# Patient Record
Sex: Female | Born: 1980 | Race: White | Hispanic: No | Marital: Married | Smoking: Never smoker
Health system: Southern US, Community
[De-identification: ages and names within clinical notes are randomized; demographics above are authoritative.]

## PROBLEM LIST (undated history)

## (undated) DIAGNOSIS — I10 Essential (primary) hypertension: Secondary | ICD-10-CM

## (undated) DIAGNOSIS — G43909 Migraine, unspecified, not intractable, without status migrainosus: Secondary | ICD-10-CM

## (undated) DIAGNOSIS — E119 Type 2 diabetes mellitus without complications: Secondary | ICD-10-CM

## (undated) HISTORY — DX: Type 2 diabetes mellitus without complications: E11.9

## (undated) HISTORY — DX: Essential (primary) hypertension: I10

## (undated) HISTORY — DX: Migraine, unspecified, not intractable, without status migrainosus: G43.909

---

## 2005-12-15 ENCOUNTER — Emergency Department (HOSPITAL_COMMUNITY): Admission: EM | Admit: 2005-12-15 | Discharge: 2005-12-15 | Payer: Self-pay | Admitting: Family Medicine

## 2006-01-25 ENCOUNTER — Emergency Department (HOSPITAL_COMMUNITY): Admission: EM | Admit: 2006-01-25 | Discharge: 2006-01-25 | Payer: Self-pay | Admitting: Family Medicine

## 2006-12-22 HISTORY — PX: MOUTH SURGERY: SHX715

## 2009-07-22 ENCOUNTER — Encounter: Admission: RE | Admit: 2009-07-22 | Discharge: 2009-08-20 | Payer: Self-pay | Admitting: Obstetrics & Gynecology

## 2009-10-12 ENCOUNTER — Inpatient Hospital Stay (HOSPITAL_COMMUNITY): Admission: AD | Admit: 2009-10-12 | Discharge: 2009-10-16 | Payer: Self-pay | Admitting: Obstetrics & Gynecology

## 2010-11-11 LAB — CBC
HCT: 32.3 % — ABNORMAL LOW (ref 36.0–46.0)
MCHC: 33 g/dL (ref 30.0–36.0)
MCV: 90.1 fL (ref 78.0–100.0)
MCV: 91.5 fL (ref 78.0–100.0)
Platelets: 175 10*3/uL (ref 150–400)
RBC: 4.12 MIL/uL (ref 3.87–5.11)
RDW: 14.4 % (ref 11.5–15.5)
WBC: 10.6 10*3/uL — ABNORMAL HIGH (ref 4.0–10.5)

## 2010-11-11 LAB — GLUCOSE, CAPILLARY
Glucose-Capillary: 85 mg/dL (ref 70–99)
Glucose-Capillary: 88 mg/dL (ref 70–99)

## 2010-11-11 LAB — GLUCOSE, RANDOM: Glucose, Bld: 79 mg/dL (ref 70–99)

## 2010-11-11 LAB — RPR: RPR Ser Ql: NONREACTIVE

## 2013-10-19 ENCOUNTER — Ambulatory Visit (INDEPENDENT_AMBULATORY_CARE_PROVIDER_SITE_OTHER): Payer: BC Managed Care – PPO | Admitting: Neurology

## 2013-10-19 ENCOUNTER — Encounter: Payer: Self-pay | Admitting: Neurology

## 2013-10-19 ENCOUNTER — Ambulatory Visit: Payer: BC Managed Care – PPO | Admitting: Neurology

## 2013-10-19 ENCOUNTER — Encounter (INDEPENDENT_AMBULATORY_CARE_PROVIDER_SITE_OTHER): Payer: Self-pay

## 2013-10-19 VITALS — BP 126/86 | HR 102 | Temp 98.2°F | Ht 61.75 in | Wt 145.0 lb

## 2013-10-19 DIAGNOSIS — G43109 Migraine with aura, not intractable, without status migrainosus: Secondary | ICD-10-CM

## 2013-10-19 MED ORDER — PROMETHAZINE HCL 12.5 MG PO TABS: 12.5000 mg | ORAL_TABLET | ORAL | Status: DC | PRN

## 2013-10-19 MED ORDER — RIZATRIPTAN BENZOATE 5 MG PO TABS: 5.0000 mg | ORAL_TABLET | ORAL | Status: DC | PRN

## 2013-10-19 NOTE — Patient Instructions (Addendum)
Please remember, common headache triggers are: sleep deprivation, dehydration, overheating, stress, hypoglycemia or skipping meals and blood sugar fluctuations, excessive pain medications or excessive alcohol use or caffeine withdrawal. Some people have food triggers such as aged cheese, orange juice or chocolate, especially dark chocolate, or MSG (monosodium glutamate). Try to avoid these headache triggers as much possible. It may be helpful to keep a headache diary to figure out what makes your headaches worse or brings them on and what alleviates them. Some people report headache onset after exercise but studies have shown that regular exercise may actually prevent headaches from coming. If you have exercise-induced headaches, please make sure that you drink plenty of fluid before and after exercising and that you do not over do it and do not overheat.  For migraine prevention, I would like to suggest no new medications, as your migraines are thankfully not too frequent at this point. Down to road, we may try something like Neurontin.   We will do a brain MRI with and without contrast, you can do this at your work.   For migraine abortive treatment, try the Maxalt orally disintegrating tab, 5 mg: take 1 pill early on when you suspect a migraine attack coming on. You may take another pill within 2 hours, no more than 2 pills in 24 hours. Most people who take triptans do not have any serious side-effects. However, they can cause drowsiness (remember to not drive or use heavy machinery when drowsy), nausea, dizziness, dry mouth. Less common side effects include strange sensations, such as tightness in your chest or throat, tingling, flushing, and feelings of heaviness or pressure in areas such as the face, limbs, and chest. These in the chest can mimic heart related pain (angina) and may cause alarm, but usually these sensations are not harmful or a sign of a heart attack. However, if you develop intense chest  pain or sensations of discomfort, you should stop taking your medication and consult with me or your PCP or go to the nearest urgent care facility or ER or call 911.   For nausea, I will prescribe phenergan, starting with 12.5 mg orally as needed. It can make you sleepy. Rare side effects are abnormal movements.

## 2013-10-19 NOTE — Progress Notes (Signed)
Subjective:    Patient ID: Andrea Love is a 33 y.o. female.  HPI    Huston FoleySaima Shawana Knoch, MD, PhD [facility] [address], [address] [address] [place], [place] [id number]  Dear Susa DayVaishali,   I saw your patient, Lorenso QuarryKristi Wells, upon your kind request in my neurologic clinic today for initial consultation of her recurrent headaches, concern for migraines. The patient is unaccompanied today. As you know, Ms. [name] is a very pleasant 33 year old right-handed woman with an underlying medical history of depression, who reports being diagnosed with migraines in April 2007. [facility] and was treated with a shot, which made her extremely sleepy. She has been having about 1-2 migraines per month and every three months, when she is on the placebo week of her OCP she has a severe migraine. She has been taking Fioricet prn. She has associated photophobia and sonophobia, and she has occasional nausea, no vomiting. She has a throbbing HA, mostly in the temporal areas. She has no associated neurological symptoms, except for intermittent L upper lip drawing sensation. No one sided weakness, numbness, tingling. She has a visual aura consisting of flashing dots or stars coming from behind into her visual fields. She has been taking Advil, tylenol or aleve prn, but all make her sleepy and Fioricet also causes some palpitations. It helps to sleep. Her migraine headaches last about 24 hours.  There is no family history of migraine. There is a family history of brain tumor and her paternal grandfather mother and her maternal great-grandfather had a brain tumor. There is epilepsy on her mother's side as well, maternal GF.  Her Past Medical History Is Significant For: History reviewed. No pertinent past medical history.  Her Past Surgical History Is Significant For: Past Surgical History  Procedure Laterality Date  . Mouth surgery  12/2006    wisdom teeth  . Cesarean section  10/13/2009     Her Family History Is Significant For: Family History  Problem Relation Age of Onset  . Diabetes Mother   . Diabetes Paternal Grandfather   . Seizures Maternal Grandfather   . Breast cancer Maternal Grandmother   . Bone cancer Maternal Grandmother   . Prostate cancer Paternal Grandfather     Her Social History Is Significant For: History   Social History  . Marital Status: Married    Spouse Name: N/A    Number of Children: N/A  . Years of Education: N/A   Social History Main Topics  . Smoking status: Never Smoker   . Smokeless tobacco: None  . Alcohol Use: 0.0 oz/week    .5 drink(s) per week  . Drug Use: No  . Sexual Activity: None   Other Topics Concern  . None   Social History Narrative  . None    Her Allergies Are:  Allergies  Allergen Reactions  . Macrobid [employer][Nitrofurantoin Monohyd Macro]   . Sulfa Antibiotics   :   Her Current Medications Are:  Outpatient Encounter Prescriptions as of 10/19/2013  Medication Sig  . acetaminophen (TYLENOL) 500 MG tablet Take 500 mg by mouth every 6 (six) hours as needed.  . butalbital-acetaminophen-caffeine (FIORICET, ESGIC) 50-325-40 MG per tablet Take 1 tablet by mouth 2 (two) times daily as needed for headache.  . ibuprofen (ADVIL,MOTRIN) 200 MG tablet Take 200 mg by mouth every 6 (six) hours as needed.  Marcille Blanco. JOLESSA 0.15-0.03 MG tablet Take 1 tablet by mouth daily.  . ciprofloxacin (CIPRO) 500 MG/5ML (10%) suspension  Take 500 mg by mouth 2 (two) times daily.  . promethazine (PHENERGAN) 12.5 MG tablet Take 1 tablet (12.5 mg total) by mouth as needed for nausea or vomiting. No more than one per day as needed for nausea associated with migraine.  . rizatriptan (MAXALT) 5 MG tablet Take 1 tablet (5 mg total) by mouth as needed for migraine. May repeat in 2 hours if needed. Take no more than 2 pills in 24 hours and no more than 3 pill a week.  :  Review of Systems:  Out of a complete 14 point review of systems, all are  reviewed and negative with the exception of these symptoms as listed below:    Review of Systems  Constitutional: Negative.   Eyes: Negative.   Respiratory: Negative.   Cardiovascular: Negative.   Gastrointestinal: Negative.   Endocrine: Negative.   Genitourinary: Negative.   Musculoskeletal: Negative.   Skin: Negative.   Allergic/Immunologic: Negative.   Neurological: Positive for headaches.  Hematological: Negative.   Psychiatric/Behavioral: Positive for dysphoric mood. The patient is nervous/anxious.     Objective:  Neurologic Exam  Physical Exam Physical Examination:   Filed Vitals:   10/19/13 1107  BP: 126/86  Pulse: 102  Temp: 98.2 F (36.8 C)    General Examination: The patient is a very pleasant 33 y.o. female in no acute distress. She appears well-developed and well-nourished and well groomed.   HEENT: Normocephalic, atraumatic, pupils are equal, round and reactive to light and accommodation. Funduscopic exam is normal with sharp disc margins noted. Extraocular tracking is good without limitation to gaze excursion or nystagmus noted. Normal smooth pursuit is noted. Hearing is grossly intact. Tympanic membranes are clear bilaterally. Face is symmetric with normal facial animation and normal facial sensation. Speech is clear with no dysarthria noted. There is no hypophonia. There is no lip, neck/head, jaw or voice tremor. Neck is supple with full range of passive and active motion. There are no carotid bruits on auscultation. Oropharynx exam reveals: mild mouth dryness, good dental hygiene and no significant airway crowding. Mallampati is class I. Tongue protrudes centrally and palate elevates symmetrically. Tonsils are small.   Chest: Clear to auscultation without wheezing, rhonchi or crackles noted.  Heart: S1+S2+0, regular and normal without murmurs, rubs or gallops noted.   Abdomen: Soft, non-tender and non-distended with normal bowel sounds appreciated on  auscultation.  Extremities: There is no pitting edema in the distal lower extremities bilaterally. Pedal pulses are intact.  Skin: Warm and dry without trophic changes noted. There are no varicose veins.  Musculoskeletal: exam reveals no obvious joint deformities, tenderness or joint swelling or erythema.   Neurologically:  Mental status: The patient is awake, alert and oriented in all 4 spheres. Her immediate and remote memory, attention, language skills and fund of knowledge are appropriate. There is no evidence of aphasia, agnosia, apraxia or anomia. Speech is clear with normal prosody and enunciation. Thought process is linear. Mood is normal and affect is normal.  Cranial nerves II - XII are as described above under HEENT exam. In addition: shoulder shrug is normal with equal shoulder height noted. Motor exam: Normal bulk, strength and tone is noted. There is no drift, tremor or rebound. Romberg is negative. Reflexes are 2+ throughout. Babinski: Toes are flexor bilaterally. Fine motor skills and coordination: intact with normal finger taps, normal hand movements, normal rapid alternating patting, normal foot taps and normal foot agility.  Cerebellar testing: No dysmetria or intention tremor on finger to nose  testing. Heel to shin is unremarkable bilaterally. There is no truncal or gait ataxia.  Sensory exam: intact to light touch, pinprick, vibration, temperature sense and proprioception in the upper and lower extremities.  Gait, station and balance: She stands easily. No veering to one side is noted. No leaning to one side is noted. Posture is age-appropriate and stance is narrow based. Gait shows normal stride length and normal pace. No problems turning are noted. She turns en bloc. Tandem walk is unremarkable. Intact toe and heel stance is noted.               Assessment and Plan:   In summary, FLORINA GLAS is a very pleasant 33 y.o.-year old female with an underlying medical history of  depression and obesity with weight loss successfully achieved in the last 6 months, whose history and physical exam are in keeping with the diagnosis of migraines with aura. Thankfully her migraines are not very frequent at this time with 2 migraine attacks reported per month on average at this point. Her physical exam is completely nonfocal. She has tried Imitrex with naproxen and combination before but it made her sleepy. It looks like she is quite sensitive to medications and even Advil can make her sleepy. I talked to her about migraine prevention and abortive treatment today. I do not think she needs to be on a daily medication for migraine prevention at this point. Nevertheless, down the Road we can consider something like gabapentin. For abortive treatment I suggested low-dose Maxalt. Again, she is quite sensitive to medications and becomes quite sleepy with even over-the-counter pain medications. Therefore, she can really only take the Maxalt when she is at home and does not have to drive. For occasional nausea I will prescribe Phenergan. We talked about side effects of Maxalt and Phenergan. We talked about maintaining a healthy lifestyle in general. I encouraged the patient to eat healthy, exercise daily and keep well hydrated, to keep a scheduled bedtime and wake time routine, to not skip any meals and eat healthy snacks in between meals and to have protein with every meal. Of note, she does not drink caffeine daily. I advised the patient about common headache triggers: sleep deprivation, dehydration, overheating, stress, hypoglycemia or skipping meals and blood sugar fluctuations, excessive pain medications or excessive alcohol use or caffeine withdrawal. Some people have food triggers such as aged cheese, orange juice or chocolate, especially dark chocolate, or MSG (monosodium glutamate). She is to try to avoid these headache triggers as much possible. It may be helpful to keep a headache diary to  figure out what makes Her headaches worse or brings them on and what alleviates them. Some people report headache onset after exercise but studies have shown that regular exercise may actually prevent headaches from coming. If She has exercise-induced headaches, She is advised to drink plenty of fluid before and after exercising and that to not overdo it and to not overheat.  As far as further diagnostic testing is concerned, I suggested the following today: I would like for her to schedule a brain MRI with and without contrast. She has a family history of brain tumor and epilepsy.  She can continue to take Fioricet as needed. She is advised not to combine Maxalt and Fioricet. I will see her back in about 6 months or sooner if needed. In the interim, once she has had her brain MRI I will call her with the test results once I receive them. Thank you  very much for allowing me to participate in the care of this nice patient. If I can be of any further assistance to you please do not hesitate to call me at [phone].  Sincerely,   Star Age, MD, PhD

## 2014-04-18 ENCOUNTER — Ambulatory Visit: Payer: BC Managed Care – PPO | Admitting: Neurology

## 2014-07-09 ENCOUNTER — Telehealth: Payer: Self-pay | Admitting: Neurology

## 2014-07-09 DIAGNOSIS — F419 Anxiety disorder, unspecified: Secondary | ICD-10-CM

## 2014-07-09 MED ORDER — ALPRAZOLAM 0.5 MG PO TABS: 0.5000 mg | ORAL_TABLET | Freq: Every evening | ORAL | Status: DC | PRN

## 2014-07-09 NOTE — Telephone Encounter (Signed)
Xanax prescribed. She can pick up prescription or we can mail. Please find out. Also, pls advise patient that she should not drive after taking Xanax and she should have someone drive her to and from the MRI test.

## 2014-07-09 NOTE — Telephone Encounter (Signed)
I called back.  Got no answer.  Left message.  

## 2014-07-09 NOTE — Telephone Encounter (Signed)
[facility] and requesting medication to help relax her.  Please call and advise.

## 2014-07-10 NOTE — Telephone Encounter (Signed)
Patient called front desk saying she would like the Rx faxed to [facility].  Rx has been faxed.  I called the patient back to advise.  Got no answer.  Left message.

## 2014-07-30 ENCOUNTER — Telehealth: Payer: Self-pay | Admitting: Neurology

## 2014-07-30 ENCOUNTER — Other Ambulatory Visit: Payer: Self-pay | Admitting: Neurology

## 2014-07-30 DIAGNOSIS — G43109 Migraine with aura, not intractable, without status migrainosus: Secondary | ICD-10-CM

## 2014-07-30 NOTE — Telephone Encounter (Signed)
Called patient and she said to fax the referral to her work at [phone],it does not need pre approval, faxed referral 07/30/14 to patient per her request

## 2014-07-30 NOTE — Telephone Encounter (Signed)
Pt is calling stating she needs a new MRI order to be [facility].  She has an appointment scheduled today @ 4:30.  The order needs to be updated, the order in the system is from February.  Please advise.

## 2014-09-11 ENCOUNTER — Telehealth: Payer: Self-pay | Admitting: Neurology

## 2014-09-11 NOTE — Telephone Encounter (Signed)
Spoke with patient and [facility], will fax results to our office for Dr Frances FurbishAthar to review

## 2014-09-11 NOTE — Telephone Encounter (Signed)
Shared MRI results with patient, she verbalized understanding and said ok.

## 2014-09-11 NOTE — Telephone Encounter (Signed)
Patient requesting MRI results.  Please call and advise. °

## 2014-09-11 NOTE — Telephone Encounter (Signed)
Please call and advise the patient that the recent scan we requested was within normal limits. 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 In particular, there were no acute findings, such as a stroke, or mass or blood products. No further action is required on this test at this time. Please remind patient to keep any upcoming appointments and to call us with any interim questions, concerns, problems or updates. Thanks,  Huston FoleySaima Esly Selvage, MD, PhD

## 2014-09-12 NOTE — Telephone Encounter (Signed)
Patient returning Ammie's call, please return call to work # [phone].

## 2014-09-12 NOTE — Telephone Encounter (Signed)
The empty sella does not typically affect hormone levels. She can ask her PCP for a referral to endocrinology but from my end of things, I do not have an indication that she needs a consultation for this.

## 2014-09-12 NOTE — Telephone Encounter (Signed)
Called and left message for return back concerning MRI questions for Dr Frances FurbishAthar

## 2014-09-12 NOTE — Telephone Encounter (Signed)
Patient has additional questions about the MRI. Please call.

## 2014-09-12 NOTE — Telephone Encounter (Signed)
Spoke with patient and she wants to know if her Pituitary gland could affect her hormone level, should she consult with an Endocrinologist? She has researched on line and is having some of the side effects such as hot flashes, mood swings, wt gain.  She has talked with her OBGYN-(Dr. Mody )and she has suggested that she address her concerns with Dr  Frances FurbishAthar.

## 2014-09-16 ENCOUNTER — Telehealth: Payer: Self-pay | Admitting: *Deleted

## 2014-09-16 NOTE — Telephone Encounter (Signed)
Shared Dr Teofilo PodAthar's  message with patient and she verbalized understanding

## 2014-09-16 NOTE — Telephone Encounter (Signed)
[facility] from [facility] on Dr. Frances FurbishAthar desk.

## 2015-09-24 DIAGNOSIS — Z01411 Encounter for gynecological examination (general) (routine) with abnormal findings: Secondary | ICD-10-CM | POA: Diagnosis not present

## 2015-09-24 DIAGNOSIS — N898 Other specified noninflammatory disorders of vagina: Secondary | ICD-10-CM | POA: Diagnosis not present

## 2015-09-24 DIAGNOSIS — R8290 Unspecified abnormal findings in urine: Secondary | ICD-10-CM | POA: Diagnosis not present

## 2015-09-24 DIAGNOSIS — Z6829 Body mass index (BMI) 29.0-29.9, adult: Secondary | ICD-10-CM | POA: Diagnosis not present

## 2015-09-24 DIAGNOSIS — E669 Obesity, unspecified: Secondary | ICD-10-CM | POA: Diagnosis not present

## 2015-09-24 MED FILL — ALPRAZolam 0.5 MG TABS: 0.5 | 10 days supply | Qty: 10 | Fill #0

## 2015-09-29 MED FILL — RIZATRIPTAN 5 MG ODT: 5 | 30 days supply | Qty: 9 | Fill #2

## 2015-09-29 MED FILL — INTROVALE 0.15-0.03 MG TAB: 0.15-0.03 | 84 days supply | Qty: 91 | Fill #0

## 2015-10-02 MED FILL — PHENTERMINE 37.5 MG TABLET: 37.5 | 30 days supply | Qty: 30 | Fill #0

## 2015-11-17 MED FILL — PHENTERMINE 37.5 MG TABLET: 37.5 | 30 days supply | Qty: 30 | Fill #1

## 2015-12-09 MED FILL — AMOXICILLIN 875 MG TABLET: 875 | 7 days supply | Qty: 14 | Fill #0

## 2015-12-09 MED FILL — FLUCONAZOLE 150 MG TABLET: 150 | 3 days supply | Qty: 2 | Fill #0

## 2015-12-27 MED FILL — INTROVALE 0.15-0.03 MG TAB: 0.15-0.03 | 84 days supply | Qty: 91 | Fill #1

## 2015-12-30 MED FILL — PHENTERMINE 37.5 MG TABLET: 37.5 | 30 days supply | Qty: 30 | Fill #2

## 2015-12-30 MED FILL — RIZATRIPTAN 5 MG ODT: 5 | 30 days supply | Qty: 9 | Fill #0

## 2016-01-13 MED FILL — AZITHROMYCIN 250 MG TABLET: 250 | 5 days supply | Qty: 6 | Fill #0

## 2016-02-25 MED FILL — PHENTERMINE 37.5 MG TABLET: 37.5 | 30 days supply | Qty: 30 | Fill #3

## 2016-03-12 MED FILL — metroNIDAZOLE 500 MG TABS: 500 | 7 days supply | Qty: 14 | Fill #0

## 2016-03-12 MED FILL — FLUCONAZOLE 150 MG TABLET: 150 | 7 days supply | Qty: 3 | Fill #0

## 2016-03-26 MED FILL — RIZATRIPTAN 5 MG ODT: 5 | 30 days supply | Qty: 9 | Fill #1

## 2016-03-26 MED FILL — INTROVALE 0.15-0.03 MG TAB: 0.15-0.03 | 84 days supply | Qty: 91 | Fill #2

## 2016-04-19 DIAGNOSIS — R300 Dysuria: Secondary | ICD-10-CM | POA: Diagnosis not present

## 2016-04-19 MED FILL — CEPHALEXIN 500 MG CAPSULE: 500 | 7 days supply | Qty: 21 | Fill #0

## 2016-04-19 MED FILL — FLUCONAZOLE 150 MG TABLET: 150 | 3 days supply | Qty: 2 | Fill #0

## 2016-04-28 MED FILL — GUAIATUSSIN AC LIQUID: 100-10 | 6 days supply | Qty: 100 | Fill #0

## 2016-04-28 MED FILL — FLUTICASONE PROP 50 MCG SPR: 50 | 30 days supply | Qty: 16 | Fill #0

## 2016-05-05 MED FILL — predniSONE 50 MG TABS: 50 | 3 days supply | Qty: 3 | Fill #0

## 2016-07-05 MED FILL — INTROVALE 0.15-0.03 MG TAB: 0.15-0.03 | 84 days supply | Qty: 91 | Fill #3

## 2016-07-08 MED FILL — FLUTICASONE PROP 50 MCG SPR: 50 | 30 days supply | Qty: 16 | Fill #1

## 2016-07-12 MED FILL — RIZATRIPTAN 5 MG ODT: 5 | 30 days supply | Qty: 9 | Fill #2

## 2016-07-14 ENCOUNTER — Encounter: Payer: 59 | Attending: Obstetrics & Gynecology | Admitting: Skilled Nursing Facility1

## 2016-07-14 DIAGNOSIS — Z713 Dietary counseling and surveillance: Secondary | ICD-10-CM | POA: Insufficient documentation

## 2016-07-20 ENCOUNTER — Encounter: Payer: Self-pay | Admitting: Skilled Nursing Facility1

## 2016-07-20 NOTE — Progress Notes (Signed)
Patient was seen on 07/14/2016 for the Weight Loss Class at [facility]. The following learning objectives were met by the patient during this class:   Describe healthy choices in each food group  Describe portion size of foods  Use plate method for meal planning  Demonstrate how to read Nutrition Facts food label  Set realistic goals for weight loss, diet changes, and physical activity.   Goals:  1. Make healthy food choices in each food group.  2. Reduce portion size of foods.  3. Increase fruit and vegetable intake.  4. Use plate method for meal planning.  5. Increase physical activity.    Handouts given:   1. Nutrition Strategies for Weight Loss   2. Meal plan/portion card   3. MyPlate Planner   4. Weight Management Recipe Resources   5. Bake, Broil, Grill   

## 2016-08-19 ENCOUNTER — Other Ambulatory Visit (INDEPENDENT_AMBULATORY_CARE_PROVIDER_SITE_OTHER): Payer: Self-pay | Admitting: Orthopaedic Surgery

## 2016-08-19 MED ORDER — AZITHROMYCIN 250 MG PO TABS
ORAL_TABLET | ORAL | 0 refills | Status: DC
Start: 2016-08-19 — End: 2016-08-20

## 2016-08-20 ENCOUNTER — Other Ambulatory Visit (INDEPENDENT_AMBULATORY_CARE_PROVIDER_SITE_OTHER): Payer: Self-pay | Admitting: Orthopaedic Surgery

## 2016-08-20 MED ORDER — AZITHROMYCIN 250 MG PO TABS: ORAL_TABLET | ORAL | 0 refills | Status: DC

## 2016-09-24 DIAGNOSIS — Z0000 Encounter for general adult medical examination without abnormal findings: Secondary | ICD-10-CM | POA: Diagnosis not present

## 2016-09-24 DIAGNOSIS — Z1329 Encounter for screening for other suspected endocrine disorder: Secondary | ICD-10-CM | POA: Diagnosis not present

## 2016-09-24 DIAGNOSIS — Z1389 Encounter for screening for other disorder: Secondary | ICD-10-CM | POA: Diagnosis not present

## 2016-09-24 DIAGNOSIS — Z131 Encounter for screening for diabetes mellitus: Secondary | ICD-10-CM | POA: Diagnosis not present

## 2016-09-24 DIAGNOSIS — Z01419 Encounter for gynecological examination (general) (routine) without abnormal findings: Secondary | ICD-10-CM | POA: Diagnosis not present

## 2016-09-24 DIAGNOSIS — Z130 Encounter for screening for diseases of the blood and blood-forming organs and certain disorders involving the immune mechanism: Secondary | ICD-10-CM | POA: Diagnosis not present

## 2016-09-24 DIAGNOSIS — N898 Other specified noninflammatory disorders of vagina: Secondary | ICD-10-CM | POA: Diagnosis not present

## 2016-09-24 MED FILL — SETLAKIN 0.15 MG-0.03 MG TA: 0.15-0.03 | 84 days supply | Qty: 91 | Fill #0

## 2016-09-30 MED FILL — metroNIDAZOLE 500 MG TABS: 500 | 7 days supply | Qty: 14 | Fill #0

## 2016-10-12 MED FILL — FLUTICASONE PROP 50 MCG SPR: 50 | 30 days supply | Qty: 16 | Fill #2

## 2016-10-12 MED FILL — RIZATRIPTAN 5 MG ODT: 5 | 30 days supply | Qty: 9 | Fill #3

## 2016-10-15 DIAGNOSIS — E669 Obesity, unspecified: Secondary | ICD-10-CM | POA: Diagnosis not present

## 2016-10-15 DIAGNOSIS — Z6833 Body mass index (BMI) 33.0-33.9, adult: Secondary | ICD-10-CM | POA: Diagnosis not present

## 2016-10-25 ENCOUNTER — Other Ambulatory Visit (INDEPENDENT_AMBULATORY_CARE_PROVIDER_SITE_OTHER): Payer: Self-pay | Admitting: Family

## 2016-10-25 DIAGNOSIS — J0110 Acute frontal sinusitis, unspecified: Secondary | ICD-10-CM

## 2016-10-25 MED ORDER — PREDNISONE 5 MG PO TABS: 5.0000 mg | ORAL_TABLET | Freq: Every day | ORAL | 0 refills | Status: DC

## 2016-10-25 MED ORDER — AMOXICILLIN-POT CLAVULANATE 875-125 MG PO TABS: 1.0000 | ORAL_TABLET | Freq: Two times a day (BID) | ORAL | 0 refills | Status: AC

## 2016-10-25 MED FILL — predniSONE 5 MG (21) TBPK: 5 | 6 days supply | Qty: 21 | Fill #0

## 2016-10-25 MED FILL — AMOX TR-K CLV 875-125 MG TA: 875-125 | 7 days supply | Qty: 14 | Fill #0

## 2016-10-25 MED FILL — QSYMIA 7.5 MG-46 MG CAPSULE: 7.5-46 | 30 days supply | Qty: 30 | Fill #0

## 2016-10-25 NOTE — Progress Notes (Signed)
Spoke with patient.   Productive cough, yellow sputum, congestion, sinus pressure and pain, headaches, body aches x 7 days. Negative flu test 2 days ago. No relief with OTC cold medicine. History of sinus infections. Sinuses tender. Lungs clear. Will treat for Sinusitis.

## 2016-11-03 ENCOUNTER — Other Ambulatory Visit (INDEPENDENT_AMBULATORY_CARE_PROVIDER_SITE_OTHER): Payer: Self-pay | Admitting: Family

## 2016-11-03 MED ORDER — FLUCONAZOLE 150 MG PO TABS: 150.0000 mg | ORAL_TABLET | Freq: Once | ORAL | 0 refills | Status: AC

## 2016-11-03 MED FILL — FLUCONAZOLE 150 MG TABLET: 150 | 3 days supply | Qty: 3 | Fill #0

## 2016-11-08 ENCOUNTER — Other Ambulatory Visit (INDEPENDENT_AMBULATORY_CARE_PROVIDER_SITE_OTHER): Payer: Self-pay | Admitting: Orthopaedic Surgery

## 2016-11-08 MED ORDER — PREDNISONE 10 MG (21) PO TBPK: ORAL_TABLET | ORAL | 0 refills | Status: DC

## 2016-11-08 MED FILL — predniSONE 10 MG (21) TBPK: 10 | 6 days supply | Qty: 21 | Fill #0

## 2016-11-26 MED FILL — QSYMIA 7.5 MG-46 MG CAPSULE: 7.5-46 | 30 days supply | Qty: 30 | Fill #1

## 2017-01-03 MED FILL — LEVONOR-ETH ESTRAD 0.15-0.0: 0.15-0.03 | 90 days supply | Qty: 91 | Fill #1

## 2017-03-16 ENCOUNTER — Other Ambulatory Visit (INDEPENDENT_AMBULATORY_CARE_PROVIDER_SITE_OTHER): Payer: Self-pay | Admitting: Orthopaedic Surgery

## 2017-03-17 ENCOUNTER — Telehealth: Payer: 59 | Admitting: Nurse Practitioner

## 2017-03-17 ENCOUNTER — Other Ambulatory Visit (INDEPENDENT_AMBULATORY_CARE_PROVIDER_SITE_OTHER): Payer: Self-pay | Admitting: Physical Medicine and Rehabilitation

## 2017-03-17 DIAGNOSIS — L259 Unspecified contact dermatitis, unspecified cause: Secondary | ICD-10-CM

## 2017-03-17 MED ORDER — PREDNISONE 10 MG (21) PO TBPK: ORAL_TABLET | ORAL | 0 refills | Status: AC

## 2017-03-17 MED FILL — predniSONE 10 MG (21) TBPK: 10 | 6 days supply | Qty: 21 | Fill #0

## 2017-03-17 NOTE — Progress Notes (Signed)

## 2017-03-17 NOTE — Telephone Encounter (Signed)
Please advise 

## 2017-03-21 MED FILL — RIZATRIPTAN 5 MG ODT: 5 | 15 days supply | Qty: 9 | Fill #0

## 2017-03-21 NOTE — Telephone Encounter (Signed)
Please advise 

## 2017-03-22 MED FILL — PHENTERMINE 37.5 MG TABLET: 37.5 | 30 days supply | Qty: 30 | Fill #0

## 2017-03-29 ENCOUNTER — Other Ambulatory Visit: Payer: Self-pay | Admitting: Occupational Medicine

## 2017-03-29 ENCOUNTER — Ambulatory Visit: Payer: Self-pay

## 2017-03-29 DIAGNOSIS — M79672 Pain in left foot: Secondary | ICD-10-CM

## 2017-04-07 ENCOUNTER — Telehealth: Payer: 59 | Admitting: Family

## 2017-04-07 DIAGNOSIS — B373 Candidiasis of vulva and vagina: Secondary | ICD-10-CM

## 2017-04-07 DIAGNOSIS — B3731 Acute candidiasis of vulva and vagina: Secondary | ICD-10-CM

## 2017-04-07 MED ORDER — FLUCONAZOLE 150 MG PO TABS: 150.0000 mg | ORAL_TABLET | Freq: Once | ORAL | 0 refills | Status: AC

## 2017-04-07 MED FILL — FLUCONAZOLE 150 MG TABLET: 150 | 1 days supply | Qty: 1 | Fill #0

## 2017-04-07 NOTE — Progress Notes (Signed)
Thank you for the details you put in the comment boxes. Those details really help us take better care of you.   We are sorry that you are not feeling well. Here is how we plan to help! Based on what you shared with me it looks like you: May have a yeast vaginosis  Vaginosis is an inflammation of the vagina that can result in discharge, itching and pain. The cause is usually a change in the normal balance of vaginal bacteria or an infection. Vaginosis can also result from reduced estrogen levels after menopause.  The most common causes of vaginosis are:   Bacterial vaginosis which results from an overgrowth of one on several organisms that are normally present in your vagina.   Yeast infections which are caused by a naturally occurring fungus called candida.   Vaginal atrophy (atrophic vaginosis) which results from the thinning of the vagina from reduced estrogen levels after menopause.   Trichomoniasis which is caused by a parasite and is commonly transmitted by sexual intercourse.  Factors that increase your risk of developing vaginosis include: . Medications, such as antibiotics and steroids . Uncontrolled diabetes . Use of hygiene products such as bubble bath, vaginal spray or vaginal deodorant . Douching . Wearing damp or tight-fitting clothing . Using an intrauterine device (IUD) for birth control . Hormonal changes, such as those associated with pregnancy, birth control pills or menopause . Sexual activity . Having a sexually transmitted infection  Your treatment plan is A single Diflucan (fluconazole) 150mg tablet once.  I have electronically sent this prescription into the pharmacy that you have chosen.  Be sure to take all of the medication as directed. Stop taking any medication if you develop a rash, tongue swelling or shortness of breath. Mothers who are breast feeding should consider pumping and discarding their breast milk while on these antibiotics. However, there is no  consensus that infant exposure at these doses would be harmful.  Remember that medication creams can weaken latex condoms. .   HOME CARE:  Good hygiene may prevent some types of vaginosis from recurring and may relieve some symptoms:  . Avoid baths, hot tubs and whirlpool spas. Rinse soap from your outer genital area after a shower, and dry the area well to prevent irritation. Don't use scented or harsh soaps, such as those with deodorant or antibacterial action. . Avoid irritants. These include scented tampons and pads. . Wipe from front to back after using the toilet. Doing so avoids spreading fecal bacteria to your vagina.  Other things that may help prevent vaginosis include:  . Don't douche. Your vagina doesn't require cleansing other than normal bathing. Repetitive douching disrupts the normal organisms that reside in the vagina and can actually increase your risk of vaginal infection. Douching won't clear up a vaginal infection. . Use a latex condom. Both female and female latex condoms may help you avoid infections spread by sexual contact. . Wear cotton underwear. Also wear pantyhose with a cotton crotch. If you feel comfortable without it, skip wearing underwear to bed. Yeast thrives in moist environments Your symptoms should improve in the next day or two.  GET HELP RIGHT AWAY IF:  . You have pain in your lower abdomen ( pelvic area or over your ovaries) . You develop nausea or vomiting . You develop a fever . Your discharge changes or worsens . You have persistent pain with intercourse . You develop shortness of breath, a rapid pulse, or you faint.  These symptoms   could be signs of problems or infections that need to be evaluated by a medical provider now.  MAKE SURE YOU    Understand these instructions.  Will watch your condition.  Will get help right away if you are not doing well or get worse.  Your e-visit answers were reviewed by a board certified advanced  clinical practitioner to complete your personal care plan. Depending upon the condition, your plan could have included both over the counter or prescription medications. Please review your pharmacy choice to make sure that you have choses a pharmacy that is open for you to pick up any needed prescription, Your safety is important to us. If you have drug allergies check your prescription carefully.   You can use MyChart to ask questions about today's visit, request a non-urgent call back, or ask for a work or school excuse for 24 hours related to this e-Visit. If it has been greater than 24 hours you will need to follow up with your provider, or enter a new e-Visit to address those concerns. You will get a MyChart message within the next two days asking about your experience. I hope that your e-visit has been valuable and will speed your recovery.  

## 2017-04-08 MED FILL — SETLAKIN 0.15 MG-0.03 MG TA: 0.15-0.03 | 90 days supply | Qty: 91 | Fill #2

## 2017-06-13 MED FILL — PHENTERMINE 37.5 MG TABLET: 37.5 | 30 days supply | Qty: 30 | Fill #1

## 2017-07-07 MED FILL — RIZATRIPTAN 5 MG ODT: 5 | 15 days supply | Qty: 9 | Fill #1

## 2017-07-07 MED FILL — SETLAKIN 0.15 MG-0.03 MG TA: 0.15-0.03 | 90 days supply | Qty: 91 | Fill #3

## 2017-07-20 ENCOUNTER — Encounter: Payer: Self-pay | Admitting: Skilled Nursing Facility1

## 2017-07-20 ENCOUNTER — Encounter: Payer: 59 | Attending: Obstetrics & Gynecology | Admitting: Skilled Nursing Facility1

## 2017-07-20 DIAGNOSIS — Z713 Dietary counseling and surveillance: Secondary | ICD-10-CM | POA: Insufficient documentation

## 2017-07-20 DIAGNOSIS — E639 Nutritional deficiency, unspecified: Secondary | ICD-10-CM | POA: Insufficient documentation

## 2017-07-20 NOTE — Progress Notes (Signed)
  Assessment:  Primary concerns today: employee self referral. Pt states she would like to learn how to eat healthily. Pt states she has been very stressed in the last year leading to increased weight gain. Pt states 160-165 pounds is her most usual weight as an adult. Pt states she experiences clammy shaky with blood sugars 45 3 times a week. Pt states her husband has had the gastric sleeve.    MEDICATIONS: See List   DIETARY INTAKE:  Usual eating pattern includes 3 meals and 1 snacks per day.  Everyday foods include none stated.  Avoided foods include none stated.    24-hr recall: dinner out every Tuesday night B ( AM): egg and cheese sandwich or yogurt and cheese or cereal----skipped Snk ( AM): L ( PM): [employer] broccoli and cheese soup in a bread bowl or fast food fried chicken with fries  Snk ( PM): granola bar  D ( PM): spagetti or tacos or pork chops Snk ( PM):  Beverages: water, sweet tea, juice   Usual physical activity: ADL's  Estimated energy needs: 1500 calories 170 g carbohydrates 112 g protein 42 g fat  Progress Towards Goal(s):  In progress.    Intervention:  Nutrition counseling for weight control. Goals: -Stop drinking sweet tea  -Cut your eating out to 1 meal a week -Speak with your doctor about hypoglycemia -Conduct exercise for 60 minutes starting out with 3 days a week   Teaching Method Utilized:  Visual Auditory Hands on  Barriers to learning/adherence to lifestyle change: none identified   Demonstrated degree of understanding via:  Teach Back   Monitoring/Evaluation:  Dietary intake, exercise, and body weight prn.

## 2017-08-12 MED FILL — PHENTERMINE 37.5 MG TABLET: 37.5 | 30 days supply | Qty: 30 | Fill #2

## 2017-09-12 ENCOUNTER — Other Ambulatory Visit (INDEPENDENT_AMBULATORY_CARE_PROVIDER_SITE_OTHER): Payer: Self-pay | Admitting: Orthopaedic Surgery

## 2017-09-12 MED ORDER — PREDNISONE 10 MG (21) PO TBPK: ORAL_TABLET | ORAL | 0 refills | Status: DC

## 2017-09-12 MED FILL — predniSONE 10 MG (21) TBPK: 10 | 6 days supply | Qty: 21 | Fill #0

## 2017-09-29 DIAGNOSIS — Z6832 Body mass index (BMI) 32.0-32.9, adult: Secondary | ICD-10-CM | POA: Diagnosis not present

## 2017-09-29 DIAGNOSIS — Z1151 Encounter for screening for human papillomavirus (HPV): Secondary | ICD-10-CM | POA: Diagnosis not present

## 2017-09-29 DIAGNOSIS — Z01419 Encounter for gynecological examination (general) (routine) without abnormal findings: Secondary | ICD-10-CM | POA: Diagnosis not present

## 2017-09-29 DIAGNOSIS — N898 Other specified noninflammatory disorders of vagina: Secondary | ICD-10-CM | POA: Diagnosis not present

## 2017-09-29 MED FILL — LEVONOR-ETH ESTRAD 0.15-0.0: 0.15-0.03 | 84 days supply | Qty: 91 | Fill #0

## 2017-09-29 MED FILL — CLINDAMYCIN 2% VAGINAL CRM: 2 | 8 days supply | Qty: 40 | Fill #0

## 2017-10-03 DIAGNOSIS — Z130 Encounter for screening for diseases of the blood and blood-forming organs and certain disorders involving the immune mechanism: Secondary | ICD-10-CM | POA: Diagnosis not present

## 2017-10-03 DIAGNOSIS — Z0000 Encounter for general adult medical examination without abnormal findings: Secondary | ICD-10-CM | POA: Diagnosis not present

## 2017-10-03 DIAGNOSIS — Z13220 Encounter for screening for lipoid disorders: Secondary | ICD-10-CM | POA: Diagnosis not present

## 2017-10-16 ENCOUNTER — Ambulatory Visit (INDEPENDENT_AMBULATORY_CARE_PROVIDER_SITE_OTHER): Payer: Self-pay | Admitting: Emergency Medicine

## 2017-10-16 VITALS — BP 115/75 | HR 99 | Temp 98.7°F | Resp 16 | Wt 181.2 lb

## 2017-10-16 DIAGNOSIS — J020 Streptococcal pharyngitis: Secondary | ICD-10-CM

## 2017-10-16 MED ORDER — AMOXICILLIN 500 MG PO CAPS: 1000.0000 mg | ORAL_CAPSULE | Freq: Two times a day (BID) | ORAL | 0 refills | Status: AC

## 2017-10-16 MED ORDER — FLUCONAZOLE 200 MG PO TABS: ORAL_TABLET | ORAL | 0 refills | Status: DC

## 2017-10-16 MED ORDER — MAGIC MOUTHWASH W/LIDOCAINE: 5.0000 mL | Freq: Three times a day (TID) | ORAL | 0 refills | Status: DC | PRN

## 2017-10-16 NOTE — Patient Instructions (Signed)

## 2017-10-16 NOTE — Progress Notes (Signed)
Subjective:     History was provided by the patient. Andrea Love is a 37 y.o. female who presents for evaluation of a sore throat. Associated symptoms include sore throat, swollen glands and white spots in throat. Onset of symptoms was 2 days ago, unchanged since that time.  She is drinking plenty of fluids. She has had recent close exposure to someone with proven streptococcal pharyngitis. Patient works as Clinical biochemistCMA and reports a POC test done at an outside clinic that was positive for strep Review of Systems Pertinent items are noted in HPI.    Objective:    BP 115/75 (BP Location: Right Arm, Patient Position: Sitting, Cuff Size: Normal)   Pulse 99   Temp 98.7 F (37.1 C) (Oral)   Resp 16   Wt 181 lb 3.2 oz (82.2 kg)   SpO2 98%   BMI 33.14 kg/m  General appearance: alert, cooperative and appears stated age Head: Normocephalic, without obvious abnormality, atraumatic Nose: Nares normal. Septum midline. Mucosa normal. No drainage or sinus tenderness. Throat: lips, mucosa, and tongue normal; teeth and gums normal Neck: cervical adenopathy Lungs: clear to auscultation bilaterally Heart: regular rate and rhythm Abdomen: soft, non-tender; bowel sounds normal; no masses,  no organomegaly Pulses: 2+ and symmetric Skin: Skin color, texture, turgor normal. No rashes or lesions    Assessment:    Strep throat.    Plan:    Patient placed on antibiotics. Use of OTC analgesics recommended as well as salt water gargles. Patient advised that he will be infectious for 24 hours after starting antibiotics. Follow up as needed.

## 2017-10-19 ENCOUNTER — Telehealth: Payer: Self-pay

## 2017-10-19 NOTE — Telephone Encounter (Signed)
Called and spoke with pt, she states she is feeling better.

## 2017-10-24 MED FILL — AZELASTINE HCL 137 MCG/SPRA: 137 | 50 days supply | Qty: 30 | Fill #0

## 2017-10-27 ENCOUNTER — Telehealth: Payer: 59 | Admitting: Physician Assistant

## 2017-10-27 DIAGNOSIS — J019 Acute sinusitis, unspecified: Secondary | ICD-10-CM

## 2017-10-27 DIAGNOSIS — B9689 Other specified bacterial agents as the cause of diseases classified elsewhere: Secondary | ICD-10-CM | POA: Diagnosis not present

## 2017-10-27 MED ORDER — DOXYCYCLINE HYCLATE 100 MG PO CAPS
100.0000 mg | ORAL_CAPSULE | Freq: Two times a day (BID) | ORAL | 0 refills | Status: DC
Start: 2017-10-27 — End: 2018-07-11

## 2017-10-27 MED FILL — DOXYCYCLINE HYCLATE 100 MG: 100 | 10 days supply | Qty: 20 | Fill #0

## 2017-10-27 NOTE — Progress Notes (Signed)

## 2017-11-06 ENCOUNTER — Ambulatory Visit (INDEPENDENT_AMBULATORY_CARE_PROVIDER_SITE_OTHER): Payer: Self-pay | Admitting: Nurse Practitioner

## 2017-11-06 VITALS — BP 136/84 | HR 94 | Temp 98.4°F | Resp 18 | Wt 212.2 lb

## 2017-11-06 DIAGNOSIS — B9689 Other specified bacterial agents as the cause of diseases classified elsewhere: Secondary | ICD-10-CM

## 2017-11-06 DIAGNOSIS — J019 Acute sinusitis, unspecified: Secondary | ICD-10-CM

## 2017-11-06 MED ORDER — AMOXICILLIN-POT CLAVULANATE ER 1000-62.5 MG PO TB12: 2.0000 | ORAL_TABLET | Freq: Two times a day (BID) | ORAL | 0 refills | Status: DC

## 2017-11-06 MED ORDER — BENZONATATE 100 MG PO CAPS: 100.0000 mg | ORAL_CAPSULE | Freq: Three times a day (TID) | ORAL | 0 refills | Status: AC | PRN

## 2017-11-06 NOTE — Patient Instructions (Addendum)

## 2017-11-06 NOTE — Progress Notes (Signed)
Subjective:  Andrea RosebushChristy S Love is a 37 y.o. female who presents for evaluation of possible sinusitis.  Symptoms include cough described as nonproductive, facial pain, nasal congestion, sinus pressure, sinus pain and wheezing.  Onset of symptoms was several weeks ago, and has been unchanged since that time. Patient was recently treated for strep throat on 2/24 and for a bacterial sinusitis on 3/7 and states her symptoms have not improved.  Patient is requesting a  z-pack which she states normally helps her symptoms. Treatment to date:  antibiotics and Amoxicillin and Doxycycline.  High risk factors for influenza complications:  none.  The following portions of the patient's history were reviewed and updated as appropriate:  allergies, current medications and past medical history.  Constitutional: positive for anorexia and fatigue, negative for chills, fevers, malaise and sweats Eyes: negative Ears, nose, mouth, throat, and face: positive for nasal congestion, negative for ear drainage, earaches and sore throat Respiratory: positive for cough, sputum and wheezing, negative for asthma, chronic bronchitis, dyspnea on exertion, pneumonia and stridor Cardiovascular: negative Gastrointestinal: positive for decreased appetite, negative for abdominal pain, constipation, diarrhea, nausea and vomiting Neurological: positive for headaches, negative for coordination problems, dizziness, paresthesia, seizures, vertigo and weakness Allergic/Immunologic: positive for hay fever Objective:  BP 136/84 (BP Location: Right Arm, Patient Position: Sitting, Cuff Size: Normal)   Pulse 94   Temp 98.4 F (36.9 C) (Oral)   Resp 18   Wt 212 lb 3.2 oz (96.3 kg)   SpO2 98%   BMI 38.81 kg/m  General appearance: alert, cooperative, fatigued and no distress Head: Normocephalic, without obvious abnormality, atraumatic Eyes: conjunctivae/corneas clear. PERRL, EOM's intact. Fundi benign. Ears: normal TM's and external ear canals  both ears Nose: no discharge, turbinates swollen, inflamed, moderate maxillary sinus tenderness bilateral, mild frontal sinus tenderness bilateral Throat: lips, mucosa, and tongue normal; teeth and gums normal Neck: no adenopathy, no carotid bruit, no JVD, supple, symmetrical, trachea midline and thyroid not enlarged, symmetric, no tenderness/mass/nodules Lungs: clear to auscultation bilaterally Heart: regular rate and rhythm, S1, S2 normal, no murmur, click, rub or gallop Abdomen: soft, non-tender; bowel sounds normal; no masses,  no organomegaly Pulses: 2+ and symmetric Skin: Skin color, texture, turgor normal. No rashes or lesions Lymph nodes: post auricular lymphadenopathy- left Neurologic: Grossly normal    Assessment:  Acute Sinusitis    Plan:  Discussed diagnosis and treatment of sinusitis. Educational material distributed and questions answered. Suggested symptomatic OTC remedies. Supportive care with appropriate antipyretics and fluids. Augmentin and Tessalon Perles per orders. Nasal steroids per orders. Follow up as needed. Discussed with patient if symptoms do not improve after this regimen of antibiotics, she will need to follow up with PCP or ENT.  Patient verbalized understanding and has no questions at time of discharge.

## 2017-11-07 ENCOUNTER — Encounter: Payer: Self-pay | Admitting: Nurse Practitioner

## 2017-11-07 MED ORDER — AMOXICILLIN-POT CLAVULANATE 875-125 MG PO TABS: 1.0000 | ORAL_TABLET | Freq: Two times a day (BID) | ORAL | 0 refills | Status: AC

## 2017-11-07 MED FILL — AMOX TR-K CLV 875-125 MG TA: 875-125 | 10 days supply | Qty: 20 | Fill #0

## 2017-11-07 MED FILL — BENZONATATE 100 MG CAP: 100 | 10 days supply | Qty: 30 | Fill #0

## 2017-11-07 NOTE — Progress Notes (Signed)
[facility] that they do not have Augmentin 1000/62.5mg  on formulary.  Requested prescription be changed to Augmentin 875/125mg  twice daily for 10 day.  Telephone order given.

## 2017-11-07 NOTE — Addendum Note (Signed)
Addended by: Jackquline BerlinLEATH, Aasir Daigler J on: 11/07/2017 10:59 AM   Modules accepted: Orders

## 2017-11-09 ENCOUNTER — Telehealth: Payer: Self-pay

## 2017-11-09 NOTE — Telephone Encounter (Signed)
Called and spoke with pt and she states that she is doing ok.

## 2018-01-06 MED FILL — LEVONOR-ETH ESTRAD 0.15-0.0: 0.15-0.03 | 84 days supply | Qty: 91 | Fill #1

## 2018-02-23 ENCOUNTER — Telehealth: Payer: 59 | Admitting: Nurse Practitioner

## 2018-02-23 DIAGNOSIS — H1033 Unspecified acute conjunctivitis, bilateral: Secondary | ICD-10-CM

## 2018-02-23 MED ORDER — OFLOXACIN 0.3 % OP SOLN: 1.0000 [drp] | Freq: Four times a day (QID) | OPHTHALMIC | 0 refills | Status: DC

## 2018-02-23 NOTE — Progress Notes (Signed)
We are sorry that you are not feeling well.  Here is how we plan to help!  Based on what you have shared with me it looks like you have conjunctivitis.  Conjunctivitis is a common inflammatory or infectious condition of the eye that is often referred to as "pink eye".  In most cases it is contagious (viral or bacterial). However, not all conjunctivitis requires antibiotics (ex. Allergic).  We have made appropriate suggestions for you based upon your presentation.  I have prescribed Oflaxacin 1-2 drops 4 times a day times 5 days   Pink eye can be highly contagious.  It is typically spread through direct contact with secretions, or contaminated objects or surfaces that one may have touched.  Strict handwashing is suggested with soap and water is urged.  If not available, use alcohol based had sanitizer.  Avoid unnecessary touching of the eye.  If you wear contact lenses, you will need to refrain from wearing them until you see no white discharge from the eye for at least 24 hours after being on medication.  You should see symptom improvement in 1-2 days after starting the medication regimen.  Call us if symptoms are not improved in 1-2 days.  Home Care:  Wash your hands often!  Do not wear your contacts until you complete your treatment plan.  Avoid sharing towels, bed linen, personal items with a person who has pink eye.  See attention for anyone in your home with similar symptoms.  Get Help Right Away If:  Your symptoms do not improve.  You develop blurred or loss of vision.  Your symptoms worsen (increased discharge, pain or redness)  Your e-visit answers were reviewed by a board certified advanced clinical practitioner to complete your personal care plan.  Depending on the condition, your plan could have included both over the counter or prescription medications.  If there is a problem please reply  once you have received a response from your provider.  Your safety is important to us.   If you have drug allergies check your prescription carefully.    You can use MyChart to ask questions about today's visit, request a non-urgent call back, or ask for a work or school excuse for 24 hours related to this e-Visit. If it has been greater than 24 hours you will need to follow up with your provider, or enter a new e-Visit to address those concerns.   You will get an e-mail in the next two days asking about your experience.  I hope that your e-visit has been valuable and will speed your recovery. Thank you for using e-visits.      

## 2018-02-24 MED FILL — OFLOXACIN 0.3% EYE DROPS: 0.3 | 7 days supply | Qty: 5 | Fill #0

## 2018-03-15 ENCOUNTER — Encounter (INDEPENDENT_AMBULATORY_CARE_PROVIDER_SITE_OTHER): Payer: Self-pay | Admitting: Orthopedic Surgery

## 2018-03-15 ENCOUNTER — Ambulatory Visit (INDEPENDENT_AMBULATORY_CARE_PROVIDER_SITE_OTHER): Payer: 59 | Admitting: Orthopedic Surgery

## 2018-03-15 DIAGNOSIS — M25531 Pain in right wrist: Secondary | ICD-10-CM | POA: Diagnosis not present

## 2018-03-17 ENCOUNTER — Encounter (INDEPENDENT_AMBULATORY_CARE_PROVIDER_SITE_OTHER): Payer: Self-pay | Admitting: Orthopedic Surgery

## 2018-03-17 DIAGNOSIS — M25531 Pain in right wrist: Secondary | ICD-10-CM | POA: Diagnosis not present

## 2018-03-17 MED ORDER — METHYLPREDNISOLONE ACETATE 40 MG/ML IJ SUSP
13.3300 mg | INTRAMUSCULAR | Status: AC | PRN
  Administered 2018-03-17: 13.33 mg

## 2018-03-17 MED ORDER — BUPIVACAINE HCL 0.25 % IJ SOLN
0.3300 mL | INTRAMUSCULAR | Status: AC | PRN
  Administered 2018-03-17: .33 mL

## 2018-03-17 NOTE — Progress Notes (Signed)
Office Visit Note   Patient: Andrea RosebushChristy S Antolin           Date of Birth: [birth year]           MRN: [id number] Visit Date: 03/15/2018 Requested by: Shea EvansMody, Vaishali, MD [address] [place], [place] [id number] PCP: Shea EvansMody, Vaishali, MD  Subjective: Chief Complaint  Patient presents with  . Left Wrist - Pain    HPI: Patient presents for evaluation of right wrist pain.  She is twirling some laundry with the wrist and she developed dorsal sided pain at the radiocarpal joint.  She had similar type symptoms 3 years ago which was resolved with intra-articular ultrasound-guided injection.  This was done by Dr. Berline Choughigby.              ROS: All systems reviewed are negative as they relate to the chief complaint within the history of present illness.  Patient denies  fevers or chills.   Assessment & Plan: Visit Diagnoses: No diagnosis found.  Plan: Impression is right wrist pain possible PCR L tendinitis or partial tearing versus intra-articular synovitis.  Since the injection worked well before we will try that again.  Under fluoroscopic guidance 1/3 cc Depo-Medrol 1/3 cc Marcaine injected into the wrist joint.  See her back as needed.  I think she should use a wrist splint for 3 or 4 days as well.  Follow-Up Instructions: Return if symptoms worsen or fail to improve.   Orders:  No orders of the defined types were placed in this encounter.  No orders of the defined types were placed in this encounter.     Procedures: Hand/UE Inj for osteoarthritis on 03/17/2018 4:40 PM Indications: pain Details: 25 G needle, fluoroscopy-guided dorsal approach Medications: 0.33 mL bupivacaine 0.25 %; 13.33 mg methylPREDNISolone acetate 40 MG/ML      Clinical Data: No additional findings.  Objective: Vital Signs: There were no vitals taken for this visit.  Physical Exam:   Constitutional: Patient appears well-developed HEENT:  Head: Normocephalic Eyes:EOM are normal Neck: Normal range of  motion Cardiovascular: Normal rate Pulmonary/chest: Effort normal Neurologic: Patient is alert Skin: Skin is warm Psychiatric: Patient has normal mood and affect    Ortho Exam: Ortho exam demonstrates good grip strength.  She does have tenderness dorsally with wrist dorsiflexion on the right.  No snuffbox tenderness.  Wrist extension strength is excellent.  No paresthesias on the dorsal or palmar aspect of the hand.  Specialty Comments:  No specialty comments available.  Imaging: No results found.   PMFS History: There are no active problems to display for this patient.  Past Medical History:  Diagnosis Date  . Diabetes mellitus without complication (HCC)     Family History  Problem Relation Age of Onset  . Diabetes Mother   . Diabetes Paternal Grandfather   . Prostate cancer Paternal Grandfather   . Seizures Maternal Grandfather   . Breast cancer Maternal Grandmother   . Bone cancer Maternal Grandmother     Past Surgical History:  Procedure Laterality Date  . CESAREAN SECTION  10/13/2009  . MOUTH SURGERY  12/2006   wisdom teeth   Social History   Occupational History  . Not on file  Tobacco Use  . Smoking status: Never Smoker  Substance and Sexual Activity  . Alcohol use: Yes    Alcohol/week: 0.3 oz    Types: 1 drink(s) per week  . Drug use: No  . Sexual activity: Not on file

## 2018-04-06 MED FILL — SETLAKIN 0.15 MG-0.03 MG TA: 0.15-0.03 | 84 days supply | Qty: 91 | Fill #2

## 2018-04-10 ENCOUNTER — Ambulatory Visit (INDEPENDENT_AMBULATORY_CARE_PROVIDER_SITE_OTHER): Payer: Self-pay | Admitting: Orthopedic Surgery

## 2018-04-27 ENCOUNTER — Ambulatory Visit (INDEPENDENT_AMBULATORY_CARE_PROVIDER_SITE_OTHER): Payer: 59 | Admitting: Surgery

## 2018-04-27 ENCOUNTER — Encounter (INDEPENDENT_AMBULATORY_CARE_PROVIDER_SITE_OTHER): Payer: Self-pay | Admitting: Surgery

## 2018-04-27 DIAGNOSIS — M25562 Pain in left knee: Secondary | ICD-10-CM

## 2018-04-27 NOTE — Progress Notes (Signed)
Office Visit Note   Patient: Andrea Love           Date of Birth: [birth year]           MRN: [id number] Visit Date: 04/27/2018              Requested by: Shea Evans, MD [address] [place], [place] [address] PCP: Shea Evans, MD   Assessment & Plan: Visit Diagnoses:  1. Acute pain of left knee   2. Mechanical knee pain, left     Plan: In hopes of giving patient some of her symptoms offered injection.  After patient consent left knee was prepped with Betadine.  After using 3 cc 1% Xylocaine for local anesthetic intra-articular Marcaine/Depo-Medrol 6:1 injection performed from a superolateral patellar approach.  Tolerated well without complication.  Will use ice off and on tonight as needed.  Can use over-the-counter NSAID.  I will see how she is feeling in a couple weeks.  If she does not have any improvement may consider getting an MRI to rule out medial meniscus tear.  I will ask Dr. Dorene Grebe to review left knee x-ray.  Follow-Up Instructions: Return if symptoms worsen or fail to improve.   Orders:  No orders of the defined types were placed in this encounter.  No orders of the defined types were placed in this encounter.     Procedures: No procedures performed   Clinical Data: No additional findings.   Subjective: Chief Complaint  Patient presents with  . Left Knee - Pain    HPI 37 year old white female is being seen today with complaints of left knee pain ongoing x1 to 2 weeks.  Patient is an employee here in our office.  She denies any specific injury.  Localizes most of her pain to the medial knee.  Some feeling of popping and catching.  She has had some soreness with going out to full extension and also feels tightness posterior aspect of the knee.  No feeling of instability.  She has tried oral NSAIDs and using ice without much improvement.  Worse over the last several days.  No complaints of lumbar, hip or radicular symptoms.   Review of Systems No  current cardiac pulmonary GI GU issues  Objective: Vital Signs: There were no vitals taken for this visit.  Physical Exam  Constitutional: She is oriented to person, place, and time. She appears well-developed. No distress.  HENT:  Head: Normocephalic and atraumatic.  Eyes: Pupils are equal, round, and reactive to light. EOM are normal.  Pulmonary/Chest: No respiratory distress.  Musculoskeletal:  Gait antalgic.  No palpable effusion.  Negative patellar apprehension.  Tender medial plica.  Mild medial joint tenderness.  Some tenderness  medial proximal tibia around the MCL insertion.  Cruciate and collateral ligaments feel stable.  Mild discomfort with McMurray's testing.  Calf nontender.  Neurovascular intact.  Neurological: She is alert and oriented to person, place, and time.  Psychiatric: She has a normal mood and affect.    Ortho Exam  Specialty Comments:  No specialty comments available.  Imaging: No results found.   PMFS History: There are no active problems to display for this patient.  Past Medical History:  Diagnosis Date  . Diabetes mellitus without complication (HCC)     Family History  Problem Relation Age of Onset  . Diabetes Mother   . Diabetes Paternal Grandfather   . Prostate cancer Paternal Grandfather   . Seizures Maternal Grandfather   . Breast cancer  Maternal Grandmother   . Bone cancer Maternal Grandmother     Past Surgical History:  Procedure Laterality Date  . CESAREAN SECTION  10/13/2009  . MOUTH SURGERY  12/2006   wisdom teeth   Social History   Occupational History  . Not on file  Tobacco Use  . Smoking status: Never Smoker  . Smokeless tobacco: Never Used  Substance and Sexual Activity  . Alcohol use: Yes    Alcohol/week: 0.5 standard drinks    Types: 1 Standard drinks or equivalent per week  . Drug use: No  . Sexual activity: Not on file    X-ray left knee today Shows joint space to be well-maintained.  No acute finding.   There is question of a small osteochondroma medial proximal tibia best seen AP view.

## 2018-06-28 MED FILL — SETLAKIN 0.15 MG-0.03 MG TA: 0.15-0.03 | 84 days supply | Qty: 91 | Fill #3

## 2018-07-11 ENCOUNTER — Encounter: Payer: Self-pay | Admitting: Family Medicine

## 2018-07-11 ENCOUNTER — Ambulatory Visit (INDEPENDENT_AMBULATORY_CARE_PROVIDER_SITE_OTHER): Payer: Self-pay | Admitting: Family Medicine

## 2018-07-11 VITALS — BP 140/98 | HR 91 | Temp 98.3°F | Wt 195.2 lb

## 2018-07-11 DIAGNOSIS — J329 Chronic sinusitis, unspecified: Secondary | ICD-10-CM

## 2018-07-11 DIAGNOSIS — R05 Cough: Secondary | ICD-10-CM

## 2018-07-11 DIAGNOSIS — R059 Cough, unspecified: Secondary | ICD-10-CM

## 2018-07-11 DIAGNOSIS — T3695XA Adverse effect of unspecified systemic antibiotic, initial encounter: Secondary | ICD-10-CM

## 2018-07-11 DIAGNOSIS — B379 Candidiasis, unspecified: Secondary | ICD-10-CM

## 2018-07-11 MED ORDER — IPRATROPIUM BROMIDE 0.06 % NA SOLN: 2.0000 | Freq: Three times a day (TID) | NASAL | 12 refills | Status: DC

## 2018-07-11 MED ORDER — PSEUDOEPH-BROMPHEN-DM 30-2-10 MG/5ML PO SYRP: 10.0000 mL | ORAL_SOLUTION | Freq: Three times a day (TID) | ORAL | 0 refills | Status: DC | PRN

## 2018-07-11 MED ORDER — AMOXICILLIN-POT CLAVULANATE 875-125 MG PO TABS: 1.0000 | ORAL_TABLET | Freq: Two times a day (BID) | ORAL | 0 refills | Status: AC

## 2018-07-11 MED ORDER — FLUCONAZOLE 150 MG PO TABS: 150.0000 mg | ORAL_TABLET | Freq: Once | ORAL | 0 refills | Status: AC

## 2018-07-11 NOTE — Patient Instructions (Signed)

## 2018-07-11 NOTE — Progress Notes (Signed)
Andrea Love is a 37 y.o. female who presents today with concerns of sinus congestion for the last 6 days. She reports using over the counter medications for treatment with minimal symptom improvement. 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  Review of Systems  Constitutional: Negative for chills, fever and malaise/fatigue.  HENT: Positive for congestion, sinus pain and sore throat. Negative for ear discharge and ear pain.   Eyes: Negative.   Respiratory: Positive for cough. Negative for sputum production and shortness of breath.   Cardiovascular: Negative.  Negative for chest pain.  Gastrointestinal: Negative for abdominal pain, diarrhea, nausea and vomiting.  Genitourinary: Negative for dysuria, frequency, hematuria and urgency.  Musculoskeletal: Negative for myalgias.  Skin: Negative.   Neurological: Negative for headaches.  Endo/Heme/Allergies: Negative.   Psychiatric/Behavioral: Negative.     O: Vitals:   07/11/18 1806  BP: (!) 140/98  Pulse: 91  Temp: 98.3 F (36.8 C)  SpO2: 97%     Physical Exam  Constitutional: She is oriented to person, place, and time. Vital signs are normal. She appears well-developed and well-nourished. She is active.  Non-toxic appearance. She does not have a sickly appearance.  HENT:  Head: Normocephalic.  Right Ear: Hearing, tympanic membrane, external ear and ear canal normal.  Left Ear: Hearing, tympanic membrane, external ear and ear canal normal.  Nose: Mucosal edema and rhinorrhea present. Right sinus exhibits maxillary sinus tenderness and frontal sinus tenderness. Left sinus exhibits maxillary sinus tenderness and frontal sinus tenderness.  Mouth/Throat: Uvula is midline and oropharynx is clear and moist.  Neck: Normal range of motion. Neck supple.  Cardiovascular: Normal rate, regular rhythm, normal heart sounds and normal pulses.   Pulmonary/Chest: Effort normal and breath sounds normal.  Abdominal: Soft. Bowel sounds are normal.  Musculoskeletal: Normal range of motion.  Lymphadenopathy:       Head (right side): No submental and no submandibular adenopathy present.       Head (left side): No submental and no submandibular adenopathy present.    She has no cervical adenopathy.  Neurological: She is alert and oriented to person, place, and time.  Psychiatric: She has a normal mood and affect.  Vitals reviewed.  A: 1. Cough   2. Sinusitis, unspecified chronicity, unspecified location   3. Antibiotic-induced yeast infection    P: Discussed exam findings, diagnosis etiology and medication use and indications reviewed with patient. Follow- Up and discharge instructions provided. No emergent/urgent issues found on exam.  Patient verbalized understanding of information provided and agrees with plan of care (POC), all questions answered.  1. Cough - brompheniramine-pseudoephedrine-DM 30-2-10 MG/5ML syrup; Take 10 mLs by mouth 3 (three) times daily as needed.  2. Sinusitis, unspecified chronicity, unspecified location - amoxicillin-clavulanate (AUGMENTIN) 875-125 MG tablet; Take 1 tablet by mouth 2 (two) times daily for 7 days. - brompheniramine-pseudoephedrine-DM 30-2-10 MG/5ML syrup; Take 10 mLs by mouth 3 (three) times daily as needed. - ipratropium (ATROVENT) 0.06 % nasal spray; Place 2 sprays into both nostrils 3 (three) times daily.  3. Antibiotic-induced yeast infection - fluconazole (DIFLUCAN) 150 MG tablet; Take 1 tablet (150 mg total) by mouth once for 1 dose.

## 2018-07-12 MED FILL — AMOX-CLAV 875-125 MG TABLET: 875-125 | 7 days supply | Qty: 14 | Fill #0

## 2018-07-12 MED FILL — FLUCONAZOLE 150 MG TABS: 150 | 1 days supply | Qty: 1 | Fill #0

## 2018-07-12 MED FILL — IPRATROPIUM 0.06% SPRAY: 0.06 | 14 days supply | Qty: 15 | Fill #0

## 2018-07-12 MED FILL — BROMPHENIR-PSEUDOEPHED-DM S: 30-2-10 | 4 days supply | Qty: 120 | Fill #0

## 2018-08-04 ENCOUNTER — Telehealth: Payer: 59 | Admitting: Family

## 2018-08-04 DIAGNOSIS — J019 Acute sinusitis, unspecified: Secondary | ICD-10-CM | POA: Diagnosis not present

## 2018-08-04 DIAGNOSIS — B9689 Other specified bacterial agents as the cause of diseases classified elsewhere: Secondary | ICD-10-CM | POA: Diagnosis not present

## 2018-08-04 MED ORDER — AZITHROMYCIN 250 MG PO TABS: ORAL_TABLET | ORAL | 0 refills | Status: DC

## 2018-08-04 MED FILL — AZITHROMYCIN 250 MG TABLET: 250 | 5 days supply | Qty: 6 | Fill #0

## 2018-08-04 NOTE — Progress Notes (Signed)
We are sorry that you are not feeling well.  Here is how we plan to help!  Based on what you have shared with me it looks like you have sinusitis.  Sinusitis is inflammation and infection in the sinus cavities of the head.  Based on your presentation I believe you most likely have Acute Bacterial Sinusitis.  This is an infection caused by bacteria and is treated with antibiotics. I have prescribed Z-pak You may use an oral decongestant such as Mucinex D or if you have glaucoma or high blood pressure use plain Mucinex. Saline nasal spray help and can safely be used as often as needed for congestion.  If you develop worsening sinus pain, fever or notice severe headache and vision changes, or if symptoms are not better after completion of antibiotic, please schedule an appointment with a health care provider.    Sinus infections are not as easily transmitted as other respiratory infection, however we still recommend that you avoid close contact with loved ones, especially the very young and elderly.  Remember to wash your hands thoroughly throughout the day as this is the number one way to prevent the spread of infection!  Home Care:  Only take medications as instructed by your medical team.  Complete the entire course of an antibiotic.  Do not take these medications with alcohol.  A steam or ultrasonic humidifier can help congestion.  You can place a towel over your head and breathe in the steam from hot water coming from a faucet.  Avoid close contacts especially the very young and the elderly.  Cover your mouth when you cough or sneeze.  Always remember to wash your hands.  Get Help Right Away If:  You develop worsening fever or sinus pain.  You develop a severe head ache or visual changes.  Your symptoms persist after you have completed your treatment plan.  Make sure you  Understand these instructions.  Will watch your condition.  Will get help right away if you are not doing  well or get worse.  Your e-visit answers were reviewed by a board certified advanced clinical practitioner to complete your personal care plan.  Depending on the condition, your plan could have included both over the counter or prescription medications.  If there is a problem please reply  once you have received a response from your provider.  Your safety is important to us.  If you have drug allergies check your prescription carefully.    You can use MyChart to ask questions about today's visit, request a non-urgent call back, or ask for a work or school excuse for 24 hours related to this e-Visit. If it has been greater than 24 hours you will need to follow up with your provider, or enter a new e-Visit to address those concerns.  You will get an e-mail in the next two days asking about your experience.  I hope that your e-visit has been valuable and will speed your recovery. Thank you for using e-visits.   

## 2018-10-02 DIAGNOSIS — Z01419 Encounter for gynecological examination (general) (routine) without abnormal findings: Secondary | ICD-10-CM | POA: Diagnosis not present

## 2018-10-02 DIAGNOSIS — Z0000 Encounter for general adult medical examination without abnormal findings: Secondary | ICD-10-CM | POA: Diagnosis not present

## 2018-10-02 DIAGNOSIS — R5383 Other fatigue: Secondary | ICD-10-CM | POA: Diagnosis not present

## 2018-10-02 DIAGNOSIS — Z130 Encounter for screening for diseases of the blood and blood-forming organs and certain disorders involving the immune mechanism: Secondary | ICD-10-CM | POA: Diagnosis not present

## 2018-10-02 DIAGNOSIS — Z13220 Encounter for screening for lipoid disorders: Secondary | ICD-10-CM | POA: Diagnosis not present

## 2018-10-02 DIAGNOSIS — R5382 Chronic fatigue, unspecified: Secondary | ICD-10-CM | POA: Diagnosis not present

## 2018-10-02 DIAGNOSIS — R635 Abnormal weight gain: Secondary | ICD-10-CM | POA: Diagnosis not present

## 2018-10-02 DIAGNOSIS — Z6835 Body mass index (BMI) 35.0-35.9, adult: Secondary | ICD-10-CM | POA: Diagnosis not present

## 2018-10-02 MED FILL — SETLAKIN 0.15 MG-0.03 MG TA: 0.15-0.03 | 84 days supply | Qty: 91 | Fill #0

## 2018-10-30 ENCOUNTER — Other Ambulatory Visit (INDEPENDENT_AMBULATORY_CARE_PROVIDER_SITE_OTHER): Payer: Self-pay | Admitting: Physical Medicine and Rehabilitation

## 2018-10-30 MED FILL — RIZATRIPTAN 5 MG ODT: 5 | 15 days supply | Qty: 9 | Fill #0

## 2018-10-30 NOTE — Telephone Encounter (Signed)
Please advise 

## 2018-12-22 MED FILL — SETLAKIN 0.15 MG-0.03 MG TA: 0.15-0.03 | 84 days supply | Qty: 91 | Fill #1

## 2019-03-08 ENCOUNTER — Telehealth: Payer: Self-pay | Admitting: Family Medicine

## 2019-03-08 ENCOUNTER — Encounter: Payer: Self-pay | Admitting: Family Medicine

## 2019-03-08 NOTE — Telephone Encounter (Signed)
Today she is having recurrent left dorsal wrist pain.  Over the past several years she has had 2 injections with good results.  Examination reveals a tiny nodule in the midline over the dorsum of the wrist.  This is tender to palpation.  Ultrasound imaging shows this to be a small ganglion cyst arising from the tendon sheath over the third extensor tendon.  Injected 1/2 cc 1% lidocaine without epinephrine and 20 mg methylprednisolone using ultrasound to guide needle placement.  Follow-up as needed.

## 2019-03-20 ENCOUNTER — Telehealth: Payer: Self-pay

## 2019-03-20 MED ORDER — AZITHROMYCIN 250 MG PO TABS: ORAL_TABLET | ORAL | 0 refills | Status: DC

## 2019-03-20 NOTE — Telephone Encounter (Signed)
I advised the patient. Rx was sent to [facility] instead of [facility]. She said it was fine - her husband will pick it up for her.

## 2019-03-20 NOTE — Addendum Note (Signed)
Addended by: Hortencia Pilar on: 03/20/2019 01:20 PM   Modules accepted: Orders

## 2019-03-20 NOTE — Telephone Encounter (Signed)
The patient is requesting a Zpak for the following symptoms x 3-4 days: sneezing, yellow-greenish nasal discharge, face is hurting, slight cough and throat is irritated. She has been using the neil med flushing until she ran out of saline. Uses [facility].

## 2019-03-20 NOTE — Telephone Encounter (Signed)
Rx sent 

## 2019-03-28 MED FILL — RIZATRIPTAN 5 MG ODT: 5 | 15 days supply | Qty: 9 | Fill #1

## 2019-03-28 MED FILL — SETLAKIN 0.15 MG-0.03 MG TA: 0.15-0.03 | 84 days supply | Qty: 91 | Fill #2

## 2019-04-23 ENCOUNTER — Other Ambulatory Visit: Payer: Self-pay | Admitting: Family Medicine

## 2019-04-23 MED ORDER — ERYTHROMYCIN 5 MG/GM OP OINT: 1.0000 "application " | TOPICAL_OINTMENT | Freq: Two times a day (BID) | OPHTHALMIC | 0 refills | Status: DC

## 2019-04-23 MED FILL — ERYTHROMYCIN EYE OINTMENT: 5 | 10 days supply | Qty: 4 | Fill #0

## 2019-04-27 MED FILL — ALPRAZolam 0.5 MG TABS: 0.5 | 10 days supply | Qty: 10 | Fill #0

## 2019-06-01 ENCOUNTER — Other Ambulatory Visit: Payer: Self-pay | Admitting: Family

## 2019-06-01 MED ORDER — FLUCONAZOLE 150 MG PO TABS: 150.0000 mg | ORAL_TABLET | Freq: Once | ORAL | 0 refills | Status: AC

## 2019-06-01 MED FILL — FLUCONAZOLE 150 MG TABS: 150 | 3 days supply | Qty: 3 | Fill #0

## 2019-06-29 MED FILL — SETLAKIN 0.15 MG-0.03 MG TA: 0.15-0.03 | 84 days supply | Qty: 91 | Fill #3

## 2019-08-13 ENCOUNTER — Other Ambulatory Visit: Payer: Self-pay

## 2019-08-13 ENCOUNTER — Emergency Department (HOSPITAL_COMMUNITY): Payer: 59

## 2019-08-13 ENCOUNTER — Emergency Department (HOSPITAL_COMMUNITY)
Admission: EM | Admit: 2019-08-13 | Discharge: 2019-08-13 | Disposition: A | Discharge status: Home / Self Care | Payer: 59 | Attending: Emergency Medicine | Admitting: Emergency Medicine

## 2019-08-13 DIAGNOSIS — E119 Type 2 diabetes mellitus without complications: Secondary | ICD-10-CM | POA: Insufficient documentation

## 2019-08-13 DIAGNOSIS — Z793 Long term (current) use of hormonal contraceptives: Secondary | ICD-10-CM | POA: Insufficient documentation

## 2019-08-13 DIAGNOSIS — Z79899 Other long term (current) drug therapy: Secondary | ICD-10-CM | POA: Diagnosis not present

## 2019-08-13 DIAGNOSIS — R079 Chest pain, unspecified: Secondary | ICD-10-CM

## 2019-08-13 DIAGNOSIS — Z20828 Contact with and (suspected) exposure to other viral communicable diseases: Secondary | ICD-10-CM | POA: Diagnosis not present

## 2019-08-13 DIAGNOSIS — R0789 Other chest pain: Secondary | ICD-10-CM | POA: Diagnosis not present

## 2019-08-13 LAB — CBC WITH DIFFERENTIAL/PLATELET
Abs Immature Granulocytes: 0.02 10*3/uL (ref 0.00–0.07)
Basophils Absolute: 0.1 10*3/uL (ref 0.0–0.1)
Basophils Relative: 1 %
Eosinophils Absolute: 0 10*3/uL (ref 0.0–0.5)
Eosinophils Relative: 0 %
HCT: 41.2 % (ref 36.0–46.0)
Hemoglobin: 13.6 g/dL (ref 12.0–15.0)
Immature Granulocytes: 0 %
Lymphocytes Relative: 21 %
Lymphs Abs: 1.5 10*3/uL (ref 0.7–4.0)
MCH: 28.5 pg (ref 26.0–34.0)
MCHC: 33 g/dL (ref 30.0–36.0)
MCV: 86.4 fL (ref 80.0–100.0)
Monocytes Absolute: 0.6 10*3/uL (ref 0.1–1.0)
Monocytes Relative: 9 %
Neutro Abs: 4.9 10*3/uL (ref 1.7–7.7)
Neutrophils Relative %: 69 %
Platelets: 272 10*3/uL (ref 150–400)
RBC: 4.77 MIL/uL (ref 3.87–5.11)
RDW: 13.7 % (ref 11.5–15.5)
WBC: 7.1 10*3/uL (ref 4.0–10.5)
nRBC: 0 % (ref 0.0–0.2)

## 2019-08-13 LAB — COMPREHENSIVE METABOLIC PANEL
ALT: 59 U/L — ABNORMAL HIGH (ref 0–44)
AST: 40 U/L (ref 15–41)
Albumin: 3.7 g/dL (ref 3.5–5.0)
Alkaline Phosphatase: 63 U/L (ref 38–126)
Anion gap: 10 (ref 5–15)
BUN: 11 mg/dL (ref 6–20)
CO2: 21 mmol/L — ABNORMAL LOW (ref 22–32)
Calcium: 9.7 mg/dL (ref 8.9–10.3)
Chloride: 108 mmol/L (ref 98–111)
Creatinine, Ser: 0.87 mg/dL (ref 0.44–1.00)
GFR calc Af Amer: >60 mL/min (ref >60)
GFR calc non Af Amer: >60 mL/min (ref >60)
Glucose, Bld: 98 mg/dL (ref 70–99)
Potassium: 3.7 mmol/L (ref 3.5–5.1)
Sodium: 139 mmol/L (ref 135–145)
Total Bilirubin: 0.3 mg/dL (ref 0.3–1.2)
Total Protein: 7.2 g/dL (ref 6.5–8.1)

## 2019-08-13 LAB — TROPONIN I (HIGH SENSITIVITY)
Troponin I (High Sensitivity): 3 ng/L (ref <18)
Troponin I (High Sensitivity): 3 ng/L (ref <18)

## 2019-08-13 LAB — D-DIMER, QUANTITATIVE: D-Dimer, Quant: 1.16 ug/mL-FEU — ABNORMAL HIGH (ref 0.00–0.50)

## 2019-08-13 LAB — SARS CORONAVIRUS 2 (TAT 6-24 HRS): SARS Coronavirus 2: NEGATIVE

## 2019-08-13 MED ORDER — IOHEXOL 350 MG/ML SOLN
71.0000 mL | Freq: Once | INTRAVENOUS | Status: AC | PRN
  Administered 2019-08-13: 71 mL via INTRAVENOUS

## 2019-08-13 MED ORDER — KETOROLAC TROMETHAMINE 30 MG/ML IJ SOLN
30.0000 mg | Freq: Once | INTRAMUSCULAR | Status: AC
  Administered 2019-08-13: 30 mg via INTRAVENOUS
  Filled 2019-08-13: qty 1

## 2019-08-13 NOTE — Discharge Instructions (Addendum)
Your covid test is pending.  See your Physician for recheck.  Tylenol or ibuprofen for pain

## 2019-08-13 NOTE — ED Triage Notes (Signed)
Pt presents from work for CP that began 2 days ago and became progressively worse today. Generalized CP, radiation to both shoulders, worse with inspiration  Or bending over,  8/10 "worse than childbirth contractions". Has taken pepcid and antacids without relief. Positive diaphoresis, denies SOB and N/V/D. Noted increase in BP this AM from SBP140 to 170s, HR in 90s at work. Denies past episodes. Sometimes experiences hypoglycemia, otherwise no significant PMH. Positive family h/o cardiac events.

## 2019-08-13 NOTE — ED Provider Notes (Signed)
[facility] EMERGENCY DEPARTMENT Provider Note   CSN: [id number] Arrival date & time: 08/13/19  1130     History Chief Complaint  Patient presents with  . Chest Pain    Andrea Love is a 38 y.o. female.  The history is provided by the patient. No language interpreter was used.  Chest Pain Pain location:  L chest Pain quality: aching and sharp   Pain radiates to:  Does not radiate Pain severity:  Moderate Onset quality:  Gradual Progression:  Worsening Chronicity:  New Relieved by:  Nothing Worsened by:  Nothing Ineffective treatments:  None tried Risk factors: birth control    Pt complains of pain in the middle of her chest.  Pt reports she took pepcid and gasx without relief.  Pt denies lifting or staining.      Past Medical History:  Diagnosis Date  . Diabetes mellitus without complication ([place])     There are no problems to display for this patient.   Past Surgical History:  Procedure Laterality Date  . CESAREAN SECTION  10/13/2009  . MOUTH SURGERY  12/2006   wisdom teeth     OB History   No obstetric history on file.     Family History  Problem Relation Age of Onset  . Diabetes Mother   . Diabetes Paternal Grandfather   . Prostate cancer Paternal Grandfather   . Seizures Maternal Grandfather   . Breast cancer Maternal Grandmother   . Bone cancer Maternal Grandmother     Social History   Tobacco Use  . Smoking status: Never Smoker  . Smokeless tobacco: Never Used  Substance Use Topics  . Alcohol use: Yes    Alcohol/week: 0.5 standard drinks    Types: 1 Standard drinks or equivalent per week  . Drug use: No    Home Medications Prior to Admission medications   Medication Sig Start Date End Date Taking? Authorizing Provider  acetaminophen (TYLENOL) 500 MG tablet Take 500 mg by mouth every 6 (six) hours as needed.    [provider]  ALPRAZolam Duanne Moron) 0.5 MG tablet Take 1 tablet (0.5 mg total) by mouth at  bedtime as needed for anxiety. For MRI scan Patient not taking: Reported on 10/16/2017 07/09/14   Star Age, MD  azithromycin (ZITHROMAX) 250 MG tablet 2 PO x 1, then 1 PO qd x 4 days 03/20/19   Hilts, Michael, MD  brompheniramine-pseudoephedrine-DM 30-2-10 MG/5ML syrup Take 10 mLs by mouth 3 (three) times daily as needed. 07/11/18   Shella Maxim, NP  butalbital-acetaminophen-caffeine (FIORICET, ESGIC) [phone] MG per tablet Take 1 tablet by mouth 2 (two) times daily as needed for headache.    [provider]  erythromycin ophthalmic ointment Place 1 application into the left eye 2 (two) times daily. 04/23/19   Hilts, Legrand Como, MD  ibuprofen (ADVIL,MOTRIN) 200 MG tablet Take 200 mg by mouth every 6 (six) hours as needed.    [provider]  ipratropium (ATROVENT) 0.06 % nasal spray Place 2 sprays into both nostrils 3 (three) times daily. 07/11/18   Shella Maxim, NP  JOLESSA 0.15-0.03 MG tablet Take 1 tablet by mouth daily. 08/20/13   [provider]  magic mouthwash w/lidocaine SOLN Take 5 mLs by mouth 3 (three) times daily as needed for mouth pain. Patient not taking: Reported on 11/06/2017 10/16/17   Barnet Glasgow, NP  ofloxacin (OCUFLOX) 0.3 % ophthalmic solution Place 1 drop into both eyes 4 (four) times daily. 02/23/18   Hassell Done, Mary-Margaret,  FNP  phentermine (ADIPEX-P) 37.5 MG tablet TAKE 1/2 TO 1 TABLET BY MOUTH EVERY MORNING Patient not taking: Reported on 10/16/2017 03/17/17   Tarry Kos, MD  rizatriptan (MAXALT) 5 MG tablet Take 1 tablet (5 mg total) by mouth as needed for migraine. May repeat in 2 hours if needed. Take no more than 2 pills in 24 hours and no more than 3 pill a week. Patient not taking: Reported on 10/16/2017 10/19/13   Huston Foley, MD  rizatriptan (MAXALT-MLT) 5 MG disintegrating tablet DISSOLVE 1 TABLET BY MOUTH ONCE A DAY AS NEEDED 10/30/18   Tyrell Antonio, MD    Allergies    Macrobid [nitrofurantoin monohyd macro] and Sulfa antibiotics   Review of Systems   Review of Systems  Cardiovascular: Positive for chest pain.  All other systems reviewed and are negative.   Physical Exam Updated Vital Signs BP (!) 136/92   Pulse 82   Temp 98.2 F (36.8 C) (Oral)   Resp 12   Ht 5\' 1"  (1.549 m)   Wt 78.9 kg   SpO2 97%   BMI 32.88 kg/m   Physical Exam Vitals and nursing note reviewed.  Constitutional:      Appearance: She is well-developed.  HENT:     Head: Normocephalic.  Cardiovascular:     Rate and Rhythm: Normal rate.     Heart sounds: Normal heart sounds.  Pulmonary:     Effort: Pulmonary effort is normal.     Breath sounds: Normal breath sounds.  Abdominal:     General: Bowel sounds are normal. There is no distension.     Palpations: Abdomen is soft.  Musculoskeletal:        General: Normal range of motion.     Cervical back: Normal range of motion.  Neurological:     General: No focal deficit present.     Mental Status: She is alert and oriented to person, place, and time.  Psychiatric:        Mood and Affect: Mood normal.     ED Results / Procedures / Treatments   Labs (all labs ordered are listed, but only abnormal results are displayed) Labs Reviewed  COMPREHENSIVE METABOLIC PANEL - Abnormal; Notable for the following components:      Result Value   CO2 21 (*)    ALT 59 (*)    All other components within normal limits  D-DIMER, QUANTITATIVE (NOT AT [facility]) - Abnormal; Notable for the following components:   D-Dimer, Quant 1.16 (*)    All other components within normal limits  SARS CORONAVIRUS 2 (TAT 6-24 HRS)  CBC WITH DIFFERENTIAL/PLATELET  TROPONIN I (HIGH SENSITIVITY)    EKG EKG Interpretation  Date/Time:  Monday August 13 2019 11:46:31 EST Ventricular Rate:  84 PR Interval:    QRS Duration: 93 QT Interval:  347 QTC Calculation: 411 R Axis:   18 Text Interpretation: Sinus rhythm Left atrial enlargement RSR' in V1 or V2, right VCD or RVH no prior available for comparison  Confirmed by [id number] [phone]) on 08/13/2019 12:04:08 PM   Radiology DG Chest Port 1 View  Result Date: 08/13/2019 CLINICAL DATA:  Chest pain EXAM: PORTABLE CHEST 1 VIEW COMPARISON:  None. FINDINGS: The heart size and mediastinal contours are within normal limits. Both lungs are clear. The visualized skeletal structures are unremarkable. IMPRESSION: Negative Electronically Signed   By: 08/15/2019 M.D.   On: 08/13/2019 12:22    Procedures Procedures (including critical care time)  Medications Ordered in  ED Medications - No data to display  ED Course  I have reviewed the triage vital signs and the nursing notes.  Pertinent labs & imaging results that were available during my care of the patient were reviewed by me and considered in my medical decision making (see chart for details).    MDM Rules/Calculators/A&P                      MDM: Pt has an elevated ddimer.  Ct scan obtained to rule out pe.  No pe.  Pt reports pain is worse with movement.  Pt given toradol IV.  Pt advised ibuprofen.  Pt advised covid is pending Final Clinical Impression(s) / ED Diagnoses Final diagnoses:  None    Rx / DC Orders ED Discharge Orders    None    An After Visit Summary was printed and given to the patient.    Osie CheeksSofia, Camia Dipinto K, PA-C 08/13/19 1618    Tilden Fossaees, Elizabeth, MD 08/15/19 [phone]

## 2019-08-13 NOTE — ED Notes (Signed)
All appropriate discharge materials reviewed at length with patient. Time for questions provided. Pt has no other questions at this time and verbalizes understanding of all provided materials.  

## 2019-08-15 ENCOUNTER — Encounter: Payer: Self-pay | Admitting: Family Medicine

## 2019-08-15 ENCOUNTER — Other Ambulatory Visit: Payer: Self-pay

## 2019-08-15 ENCOUNTER — Ambulatory Visit (INDEPENDENT_AMBULATORY_CARE_PROVIDER_SITE_OTHER): Payer: 59 | Admitting: Family Medicine

## 2019-08-15 VITALS — BP 128/82 | HR 86

## 2019-08-15 DIAGNOSIS — R0789 Other chest pain: Secondary | ICD-10-CM

## 2019-08-15 DIAGNOSIS — M542 Cervicalgia: Secondary | ICD-10-CM

## 2019-08-15 MED ORDER — KETOROLAC TROMETHAMINE 10 MG PO TABS: 10.0000 mg | ORAL_TABLET | Freq: Three times a day (TID) | ORAL | 0 refills | Status: DC | PRN

## 2019-08-15 MED FILL — KETOROLAC 10 MG TABLET: 10 | 10 days supply | Qty: 30 | Fill #0

## 2019-08-15 NOTE — Progress Notes (Signed)
   Office Visit Note   Patient: Andrea Love           Date of Birth: Jul 08, 1981           MRN: [id number] Visit Date: 08/15/2019 Requested by: Azucena Fallen, MD [address] [place],  [place] [address] PCP: Azucena Fallen, MD  Subjective: Chief Complaint  Patient presents with  . f/u ED visit - neck/head pain, and left upper chest pain    HPI: She is here with neck and left anterior chest pain.  The other day she developed fairly severe epigastric pain and went to the ER since Gas-X and Tums were not helping.  EKG was negative, D-dimer was positive so a CT scan of her chest was obtained which was negative for pulmonary embolus.  She was given a Toradol injection and it eliminated her pain for several hours so she was discharged home.  She has no further epigastric pain but she does have some left chest pain when she takes a deep breath.  Covid testing was negative.  She is also having pain at the base of her skull on the left side which is triggering migraine headaches.               ROS: No fever or chills.  All other systems were reviewed and are negative.  Objective: Vital Signs: BP 128/82   Pulse 86   Physical Exam:  General:  Alert and oriented, in no acute distress. Pulm:  Breathing unlabored. Psy:  Normal mood, congruent affect. Skin: No visible rash. Neck: She is tender to palpation in the upper left cervical spine at the occiput, she has a symptomatic trigger point there. Lungs: Clear to auscultation throughout. Chest wall: She does not have any tenderness to self palpation of her chest wall.  Strength testing of the pectoralis muscle does not elicit any pain.  Imaging: None today  Assessment & Plan: 1.  Left chest wall and neck pain, with elevated D-dimer but negative testing for DVT and pulmonary embolus.  Question inflammatory process of some sort. -Oral Toradol for the next several days.  Medrol Dosepak if symptoms persist.  Follow-up as  needed.     Procedures: No procedures performed  No notes on file     PMFS History: There are no problems to display for this patient.  Past Medical History:  Diagnosis Date  . Diabetes mellitus without complication ([place])     Family History  Problem Relation Age of Onset  . Diabetes Mother   . Diabetes Paternal Grandfather   . Prostate cancer Paternal Grandfather   . Seizures Maternal Grandfather   . Breast cancer Maternal Grandmother   . Bone cancer Maternal Grandmother     Past Surgical History:  Procedure Laterality Date  . CESAREAN SECTION  10/13/2009  . MOUTH SURGERY  12/2006   wisdom teeth   Social History   Occupational History  . Not on file  Tobacco Use  . Smoking status: Never Smoker  . Smokeless tobacco: Never Used  Substance and Sexual Activity  . Alcohol use: Yes    Alcohol/week: 0.5 standard drinks    Types: 1 Standard drinks or equivalent per week  . Drug use: No  . Sexual activity: Not on file

## 2019-08-29 ENCOUNTER — Telehealth: Payer: Self-pay | Admitting: Family Medicine

## 2019-08-29 MED ORDER — ENALAPRIL MALEATE 5 MG PO TABS: 5.0000 mg | ORAL_TABLET | Freq: Every day | ORAL | 3 refills | Status: DC

## 2019-08-29 MED FILL — ENALAPRIL MALEATE 5 MG TABS: 5 | 30 days supply | Qty: 30 | Fill #0

## 2019-08-29 NOTE — Telephone Encounter (Signed)
Blood pressure has been running consistently high.  She is having some headaches as well.  We will start enalapril.  Recheck over the next few weeks.  She is working on lifestyle changes as well but she has been under a lot of stress lately.

## 2019-09-24 MED FILL — ENALAPRIL MALEATE 5 MG TABS: 5 | 30 days supply | Qty: 30 | Fill #1

## 2019-10-02 ENCOUNTER — Other Ambulatory Visit: Payer: Self-pay | Admitting: Family

## 2019-10-02 MED ORDER — CEPHALEXIN 500 MG PO CAPS: 500.0000 mg | ORAL_CAPSULE | Freq: Three times a day (TID) | ORAL | 0 refills | Status: DC

## 2019-10-02 MED FILL — CEPHALEXIN 500 MG CAPSULE: 500 | 10 days supply | Qty: 30 | Fill #0

## 2019-10-05 DIAGNOSIS — Z01419 Encounter for gynecological examination (general) (routine) without abnormal findings: Secondary | ICD-10-CM | POA: Diagnosis not present

## 2019-10-05 MED FILL — SETLAKIN 0.15 MG-0.03 MG TA: 0.15-0.03 | 84 days supply | Qty: 91 | Fill #0

## 2019-10-12 ENCOUNTER — Encounter: Payer: Self-pay | Admitting: Family Medicine

## 2019-10-12 ENCOUNTER — Other Ambulatory Visit: Payer: Self-pay | Admitting: Family Medicine

## 2019-10-12 ENCOUNTER — Telehealth: Payer: Self-pay | Admitting: Family Medicine

## 2019-10-12 MED ORDER — TELMISARTAN 20 MG PO TABS: 20.0000 mg | ORAL_TABLET | Freq: Every day | ORAL | 6 refills | Status: DC

## 2019-10-12 MED FILL — TELMISARTAN 20 MG TABLET: 20 | 30 days supply | Qty: 30 | Fill #0

## 2019-10-12 NOTE — Telephone Encounter (Signed)
Cough due to enalapril.  Will switch to micardis.

## 2019-10-15 DIAGNOSIS — Z130 Encounter for screening for diseases of the blood and blood-forming organs and certain disorders involving the immune mechanism: Secondary | ICD-10-CM | POA: Diagnosis not present

## 2019-10-15 DIAGNOSIS — Z1329 Encounter for screening for other suspected endocrine disorder: Secondary | ICD-10-CM | POA: Diagnosis not present

## 2019-10-15 DIAGNOSIS — Z0000 Encounter for general adult medical examination without abnormal findings: Secondary | ICD-10-CM | POA: Diagnosis not present

## 2019-10-15 DIAGNOSIS — Z13220 Encounter for screening for lipoid disorders: Secondary | ICD-10-CM | POA: Diagnosis not present

## 2019-10-15 DIAGNOSIS — Z131 Encounter for screening for diabetes mellitus: Secondary | ICD-10-CM | POA: Diagnosis not present

## 2019-11-05 MED FILL — TELMISARTAN 20 MG TABLET: 20 | 30 days supply | Qty: 30 | Fill #1

## 2019-11-07 ENCOUNTER — Other Ambulatory Visit: Payer: Self-pay | Admitting: Family

## 2019-11-07 MED ORDER — FLUCONAZOLE 150 MG PO TABS: 150.0000 mg | ORAL_TABLET | Freq: Once | ORAL | 0 refills | Status: AC

## 2019-11-07 MED FILL — FLUCONAZOLE 150 MG TABS: 150 | 3 days supply | Qty: 3 | Fill #0

## 2019-11-22 DIAGNOSIS — Z0100 Encounter for examination of eyes and vision without abnormal findings: Secondary | ICD-10-CM | POA: Diagnosis not present

## 2019-12-03 MED FILL — TELMISARTAN 20 MG TABS: 20 | 30 days supply | Qty: 30 | Fill #2

## 2019-12-31 MED FILL — TELMISARTAN 20 MG TABS: 20 | 30 days supply | Qty: 30 | Fill #3

## 2019-12-31 MED FILL — SETLAKIN 0.15 MG-0.03 MG TA: 0.15-0.03 | 84 days supply | Qty: 91 | Fill #1

## 2020-02-29 MED FILL — ALPRAZolam 0.5 MG TABS: 0.5 | 10 days supply | Qty: 10 | Fill #0

## 2020-03-02 MED FILL — TELMISARTAN 20 MG TABS: 20 | 30 days supply | Qty: 30 | Fill #5

## 2020-03-05 IMAGING — DX DG CHEST 1V PORT
1 series · 1 of 1 positions shown · non-contrast
Comparison: None.

CLINICAL DATA: Chest pain

EXAM:
PORTABLE CHEST 1 VIEW

[chest ap]
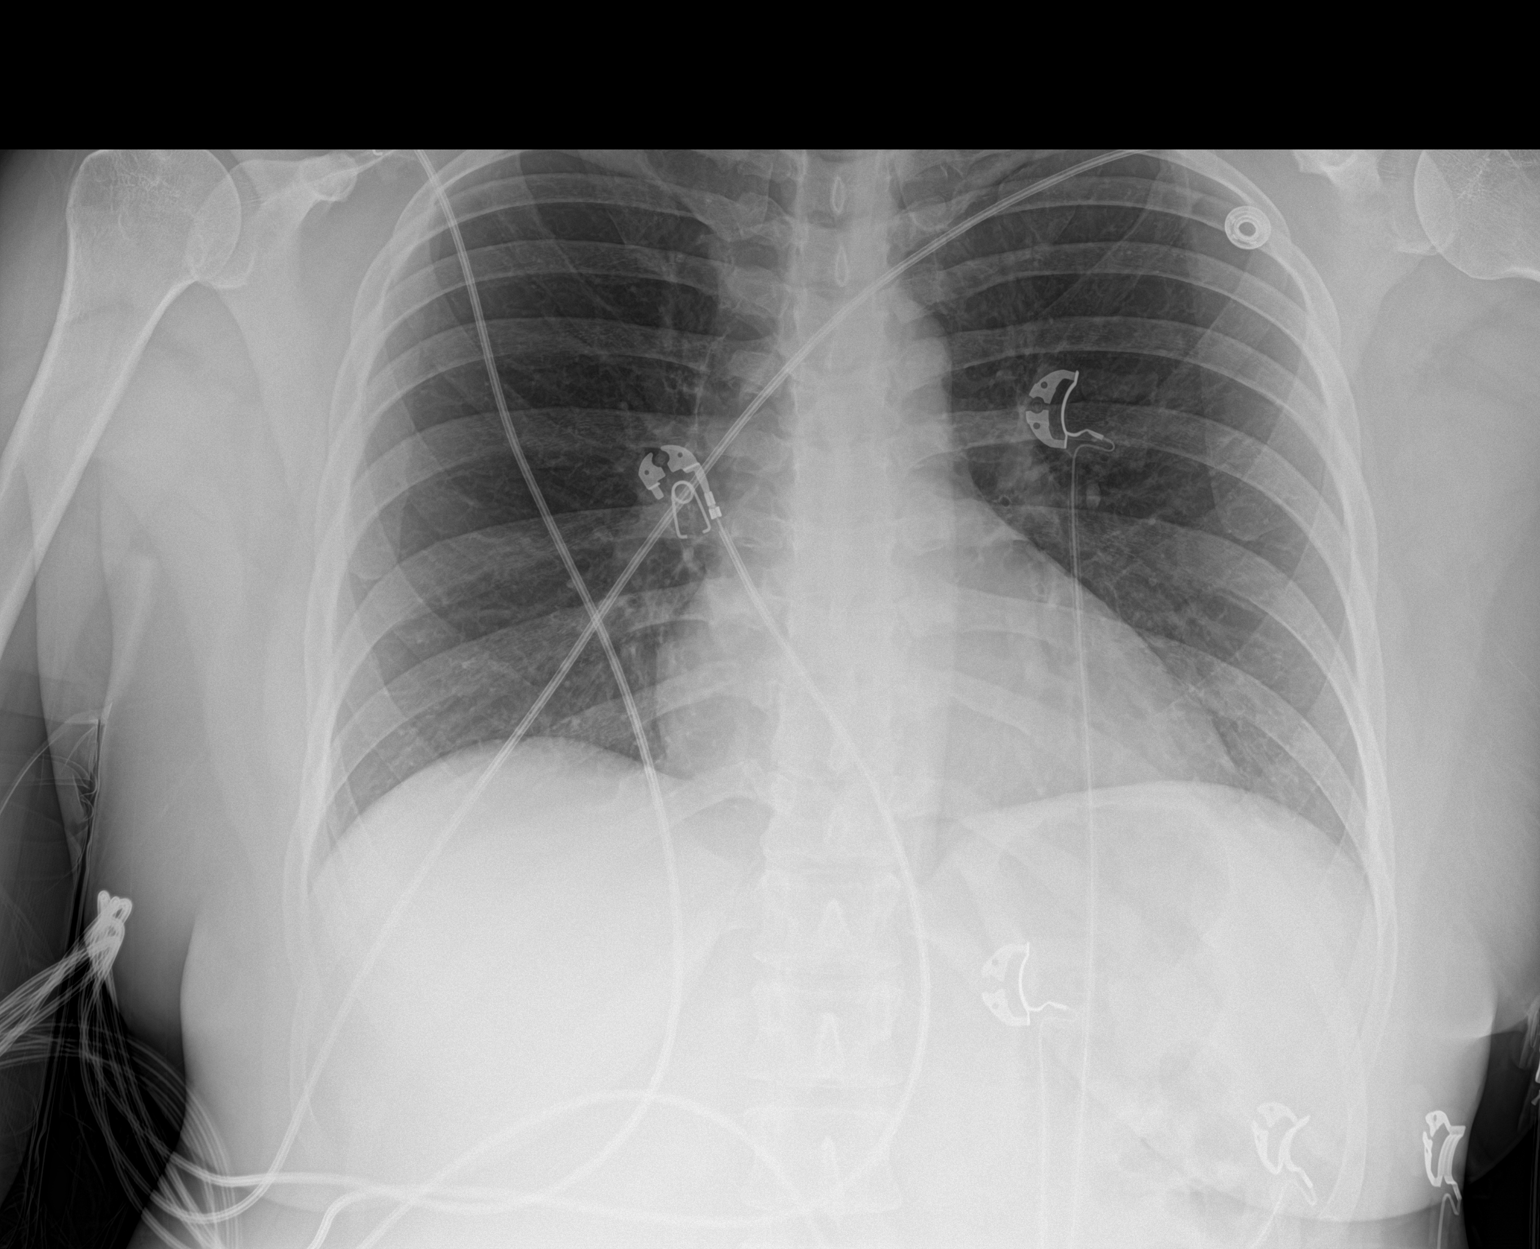

[1 of 1 positions shown; findings below may reference images not displayed]

FINDINGS: The heart size and mediastinal contours are within normal limits.
Both lungs are clear. The visualized skeletal structures are
unremarkable.
IMPRESSION: Negative

## 2020-03-05 IMAGING — CT CT ANGIO CHEST
2 of 6 series · 18 of 46 positions shown · IV contrast (omnipaque)
Comparison: None.

CLINICAL DATA: Chest pain for 2 days.

EXAM:
CT ANGIOGRAPHY CHEST WITH CONTRAST
TECHNIQUE: Multidetector CT imaging of the chest was performed using the
standard protocol during bolus administration of intravenous
contrast. Multiplanar CT image reconstructions and MIPs were
obtained to evaluate the vascular anatomy.
CONTRAST:  71mL OMNIPAQUE IOHEXOL 350 MG/ML SOLN

[Series 6: thins · axial · 0.64mm/px · z∈[-368,-154]mm · 15 of 236 slices shown]
[im 11/236  lung]
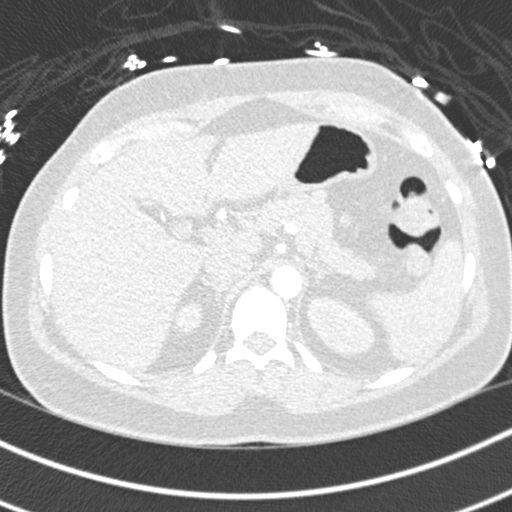
[im 31/236  soft-tissue]
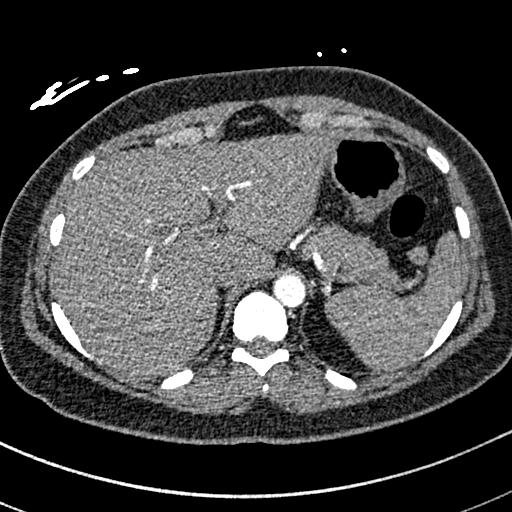
[im 41/236  lung]
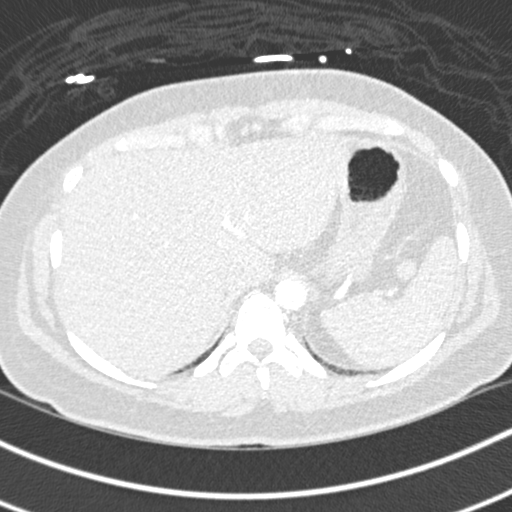
[im 62/236  soft-tissue]
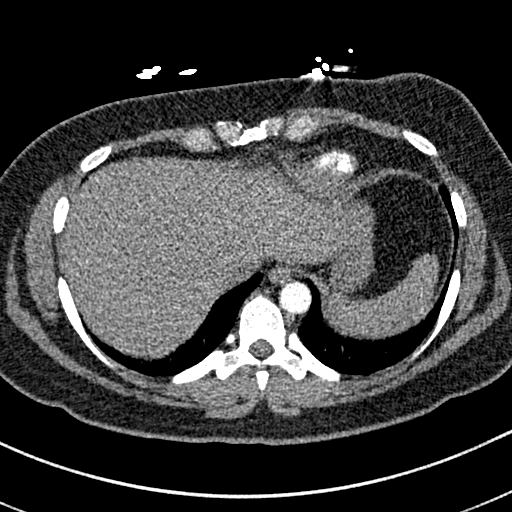
[im 72/236  lung]
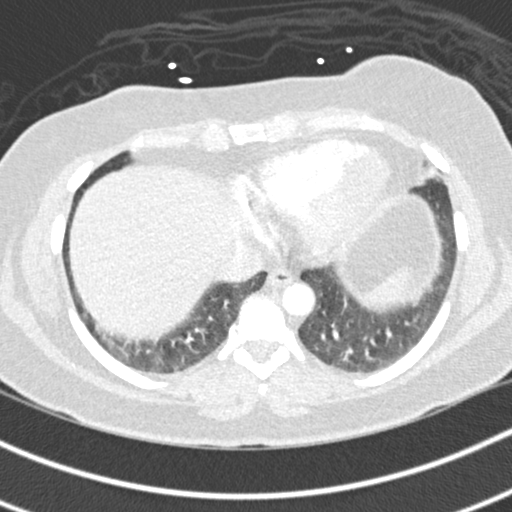
[im 92/236  soft-tissue]
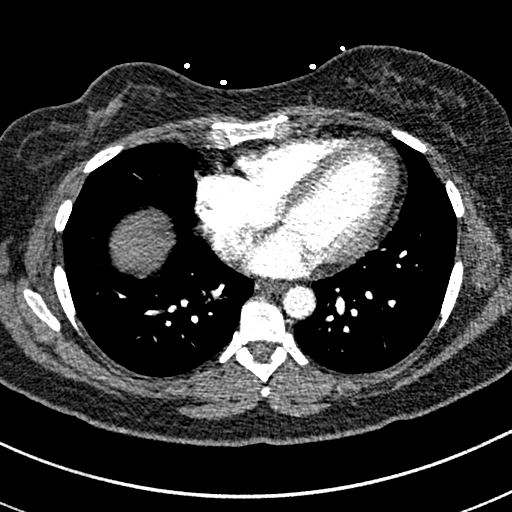
[im 103/236  lung]
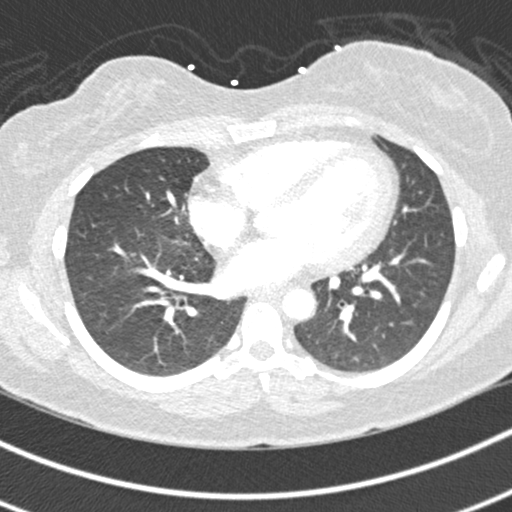
[im 123/236  soft-tissue]
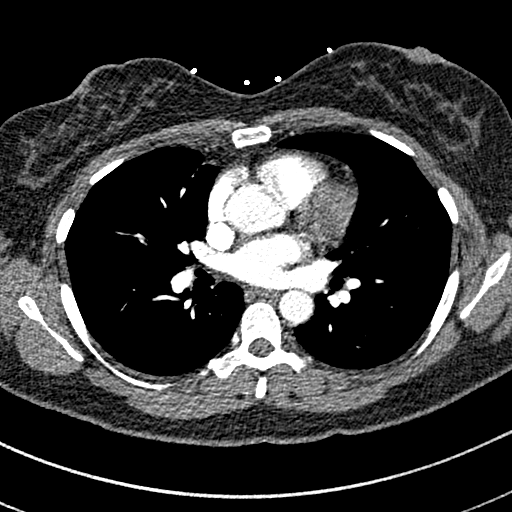
[im 133/236  lung]
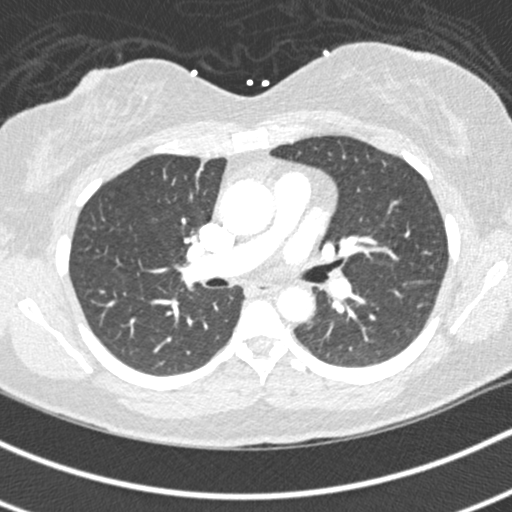
[im 144/236  soft-tissue]
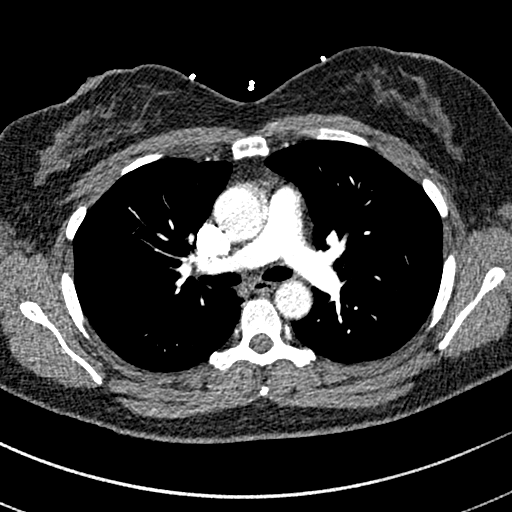
[im 164/236  lung]
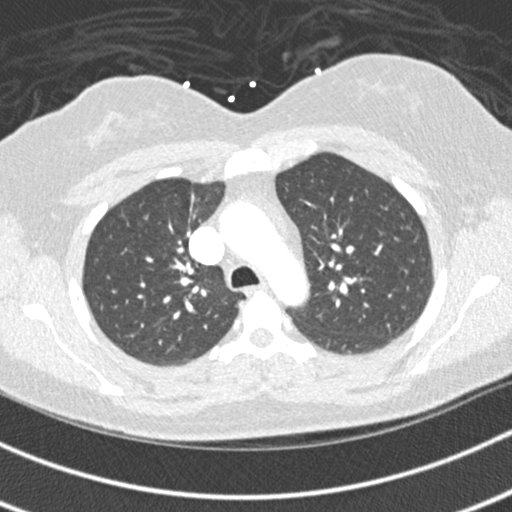
[im 174/236  soft-tissue]
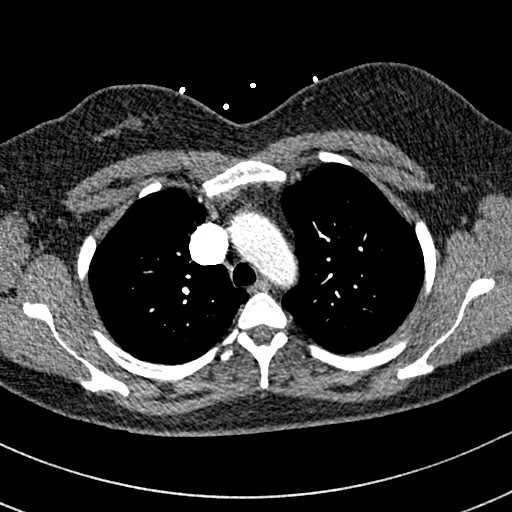
[im 195/236  lung]
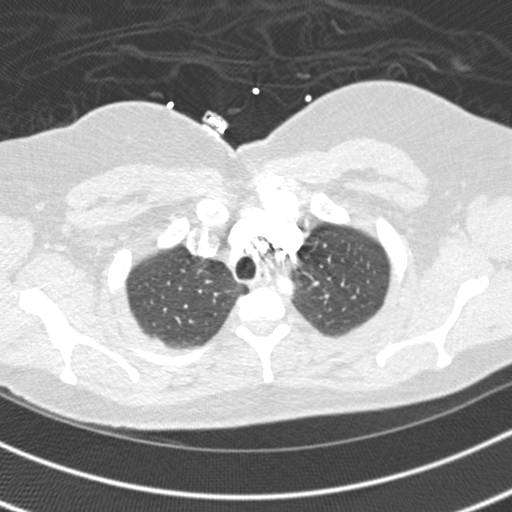
[im 205/236  soft-tissue]
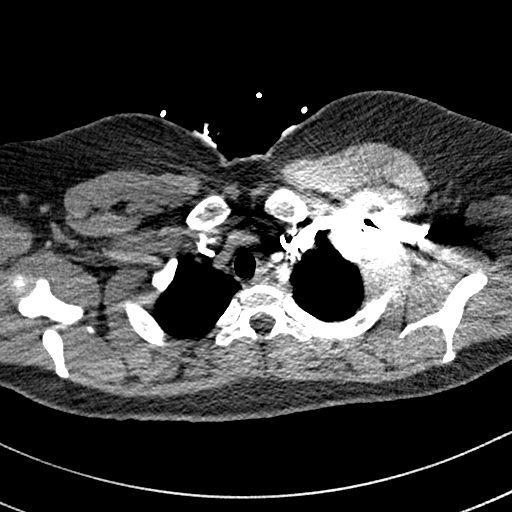
[im 225/236  lung]
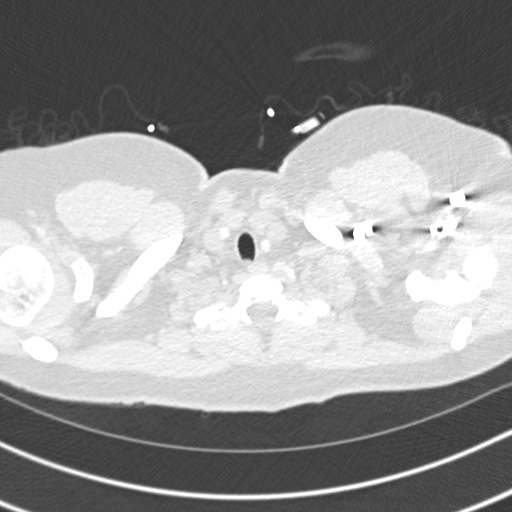

[Series 8: coronal mpr · coronal · 0.50mm/px · 3 of 142 slices shown]
[im 36/142  soft-tissue]
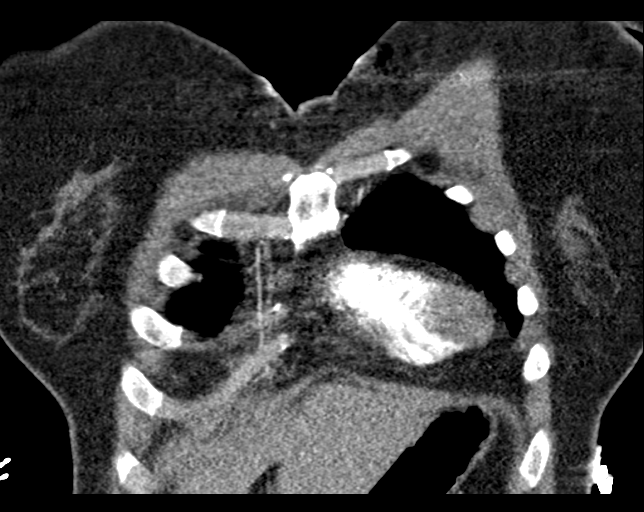
[im 71/142  soft-tissue]
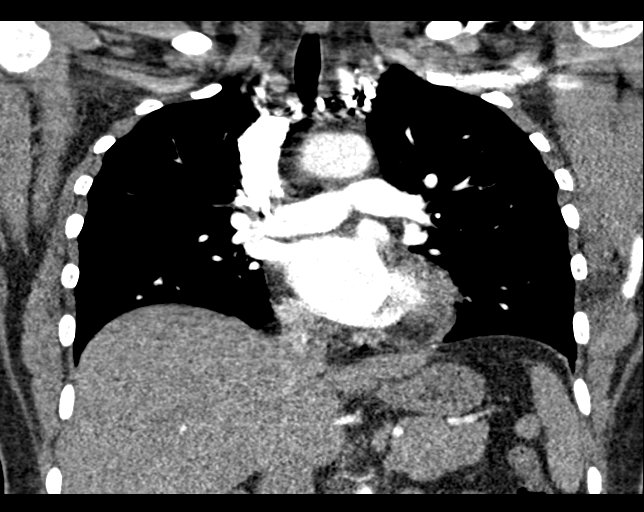
[im 106/142  soft-tissue]
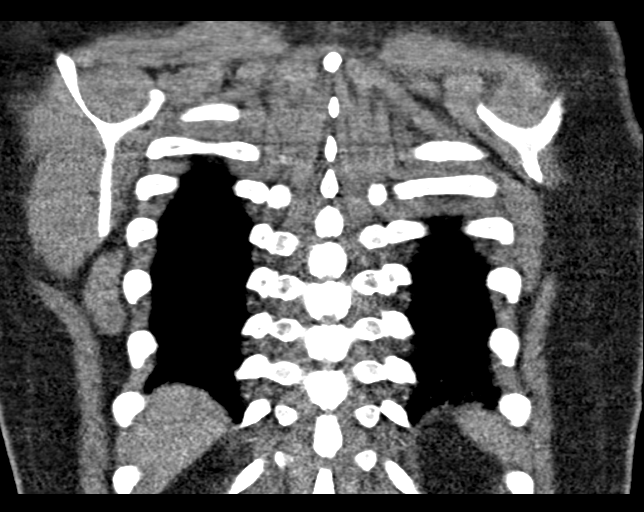

[18 of 46 positions shown; findings below may reference images not displayed]

FINDINGS: Cardiovascular: The heart is normal in size. No pericardial
effusion. The aorta is normal in caliber. No dissection. No
atherosclerotic calcifications. The branch vessels are patent. No
coronary artery calcifications.

The pulmonary arterial tree is well opacified. No filling defects to
suggest pulmonary embolism.

Mediastinum/Nodes: No mediastinal or hilar mass or adenopathy. The
thyroid gland appears normal. The esophagus is grossly normal.

Lungs/Pleura: No acute pulmonary findings. No pulmonary lesions or
pleural effusions.

Upper Abdomen: No significant upper abdominal findings.

Musculoskeletal: No significant bony findings. No breast masses,
supraclavicular or axillary adenopathy.

Review of the MIP images confirms the above findings.
IMPRESSION: 1. No CT findings for pulmonary embolism.
2. Normal thoracic aorta.
3. No acute pulmonary findings or worrisome pulmonary lesions.
4. No significant upper abdominal findings.

## 2020-03-23 MED FILL — SETLAKIN 0.15 MG-0.03 MG TA: 0.15-0.03 | 84 days supply | Qty: 91 | Fill #2

## 2020-04-01 MED FILL — TELMISARTAN 20 MG TABS: 20 | 30 days supply | Qty: 30 | Fill #6

## 2020-04-14 ENCOUNTER — Other Ambulatory Visit: Payer: Self-pay | Admitting: Family Medicine

## 2020-04-14 MED ORDER — COLCHICINE 0.6 MG PO CAPS: 1.0000 | ORAL_CAPSULE | Freq: Two times a day (BID) | ORAL | 3 refills | Status: DC | PRN

## 2020-04-14 MED FILL — COLCHICINE 0.6 MG CAPS: 0.6 | 30 days supply | Qty: 60 | Fill #0

## 2020-04-14 NOTE — Progress Notes (Signed)
She is having left great toe IP joint stiffness and pain.  Symptoms started about a week or 2 ago.  She has a history of problems in her toe roughly 5 or 6 years ago, at that time she had an ultrasound-guided injection.  It has not bothered her since then.  There was concerned about the possibility of gout.  Presently her left wrist is a little bit stiff as well.  I briefly imaged her great toe with ultrasound and it appears that she has a ganglion cyst on the dorsum of the IP joint near the extensor tendon, but it seems to arise from the joint itself.  There is a little bit of hyperemia on power Doppler imaging, but not a large effusion.  We will treat with colchicine and Voltaren gel.  If symptoms persist, we will check a uric acid level and consider injecting the joint again.

## 2020-06-23 ENCOUNTER — Other Ambulatory Visit: Payer: Self-pay | Admitting: Family Medicine

## 2020-06-23 MED ORDER — FLUTICASONE PROPIONATE 50 MCG/ACT NA SUSP: 2.0000 | Freq: Every day | NASAL | 11 refills | Status: DC

## 2020-06-23 MED FILL — FLUTICASONE PROP 50 MCG SPR: 50 | 30 days supply | Qty: 16 | Fill #0

## 2020-06-23 MED FILL — SETLAKIN 0.15 MG-0.03 MG TA: 0.15-0.03 | 84 days supply | Qty: 91 | Fill #3

## 2020-06-30 ENCOUNTER — Other Ambulatory Visit: Payer: Self-pay | Admitting: Family Medicine

## 2020-06-30 MED ORDER — NABUMETONE 750 MG PO TABS: 750.0000 mg | ORAL_TABLET | Freq: Two times a day (BID) | ORAL | 6 refills | Status: DC | PRN

## 2020-06-30 MED FILL — NABUMETONE 750 MG TABS: 750 | 30 days supply | Qty: 60 | Fill #0

## 2020-06-30 NOTE — Progress Notes (Signed)
Having right plantar fascia pain for the past month.  Will try stretches, ice, relafen.  Consider injection or PT if persists.

## 2020-07-09 ENCOUNTER — Other Ambulatory Visit: Payer: Self-pay | Admitting: Family Medicine

## 2020-07-09 MED ORDER — METHYLPREDNISOLONE 4 MG PO TBPK: ORAL_TABLET | ORAL | 0 refills | Status: DC

## 2020-07-09 MED FILL — METHYLPREDNISOLONE 4 MG TBP: 4 | 6 days supply | Qty: 21 | Fill #0

## 2020-08-04 ENCOUNTER — Other Ambulatory Visit: Payer: Self-pay | Admitting: Family Medicine

## 2020-08-04 MED ORDER — AZITHROMYCIN 250 MG PO TABS: ORAL_TABLET | ORAL | 0 refills | Status: DC

## 2020-08-04 MED FILL — AZITHROMYCIN 250 MG TABLET: 250 | 5 days supply | Qty: 6 | Fill #0

## 2020-08-04 MED FILL — FLUTICASONE PROP 50 MCG SPR: 50 | 30 days supply | Qty: 16 | Fill #1

## 2020-08-21 ENCOUNTER — Telehealth: Payer: Self-pay | Admitting: Family Medicine

## 2020-08-21 NOTE — Telephone Encounter (Signed)
Right plantar fascia injected today with 1 cc 0.25% bupivacaine and 6 mg betamethasone.  Left great toe IP joint dorsal cyst injected today with 1 cc 0.25% bupivacaine and 3 mg betamethasone.

## 2020-09-10 ENCOUNTER — Other Ambulatory Visit (HOSPITAL_COMMUNITY): Payer: Self-pay | Admitting: Obstetrics & Gynecology

## 2020-09-10 MED FILL — FLUTICASONE PROP 50 MCG SPR: 50 | 30 days supply | Qty: 16 | Fill #2

## 2020-09-19 MED FILL — SETLAKIN 0.15 MG-0.03 MG TA: 0.15-0.03 | 91 days supply | Qty: 91 | Fill #0

## 2020-10-06 ENCOUNTER — Other Ambulatory Visit (HOSPITAL_COMMUNITY): Payer: Self-pay | Admitting: Obstetrics & Gynecology

## 2020-10-06 DIAGNOSIS — Z1329 Encounter for screening for other suspected endocrine disorder: Secondary | ICD-10-CM | POA: Diagnosis not present

## 2020-10-06 DIAGNOSIS — Z3041 Encounter for surveillance of contraceptive pills: Secondary | ICD-10-CM | POA: Diagnosis not present

## 2020-10-06 DIAGNOSIS — Z124 Encounter for screening for malignant neoplasm of cervix: Secondary | ICD-10-CM | POA: Diagnosis not present

## 2020-10-06 DIAGNOSIS — Z01419 Encounter for gynecological examination (general) (routine) without abnormal findings: Secondary | ICD-10-CM | POA: Diagnosis not present

## 2020-10-06 DIAGNOSIS — Z131 Encounter for screening for diabetes mellitus: Secondary | ICD-10-CM | POA: Diagnosis not present

## 2020-10-06 DIAGNOSIS — F418 Other specified anxiety disorders: Secondary | ICD-10-CM | POA: Diagnosis not present

## 2020-10-06 DIAGNOSIS — Z6835 Body mass index (BMI) 35.0-35.9, adult: Secondary | ICD-10-CM | POA: Diagnosis not present

## 2020-10-06 DIAGNOSIS — Z113 Encounter for screening for infections with a predominantly sexual mode of transmission: Secondary | ICD-10-CM | POA: Diagnosis not present

## 2020-10-06 DIAGNOSIS — E559 Vitamin D deficiency, unspecified: Secondary | ICD-10-CM | POA: Diagnosis not present

## 2020-10-06 DIAGNOSIS — Z13220 Encounter for screening for lipoid disorders: Secondary | ICD-10-CM | POA: Diagnosis not present

## 2020-10-06 DIAGNOSIS — Z0000 Encounter for general adult medical examination without abnormal findings: Secondary | ICD-10-CM | POA: Diagnosis not present

## 2020-10-06 DIAGNOSIS — Z01411 Encounter for gynecological examination (general) (routine) with abnormal findings: Secondary | ICD-10-CM | POA: Diagnosis not present

## 2020-10-06 MED FILL — buPROPion HCL ER (XL) 150 M: 150 | 90 days supply | Qty: 90 | Fill #0

## 2020-10-07 MED FILL — FLUTICASONE PROP 50 MCG SPR: 50 | 30 days supply | Qty: 16 | Fill #3

## 2020-10-28 ENCOUNTER — Other Ambulatory Visit (HOSPITAL_COMMUNITY): Payer: Self-pay | Admitting: Obstetrics & Gynecology

## 2020-10-28 MED FILL — FLUCONAZOLE 150 MG TABS: 150 | 1 days supply | Qty: 1 | Fill #0

## 2020-10-28 MED FILL — METRONIDAZOLE 500 MG TABS: 500 | 7 days supply | Qty: 14 | Fill #0

## 2020-11-03 ENCOUNTER — Other Ambulatory Visit: Payer: Self-pay | Admitting: Family Medicine

## 2020-11-03 ENCOUNTER — Telehealth: Payer: Self-pay

## 2020-11-03 MED ORDER — TELMISARTAN 20 MG PO TABS: 20.0000 mg | ORAL_TABLET | Freq: Every day | ORAL | 3 refills | Status: DC

## 2020-11-03 NOTE — Telephone Encounter (Signed)
The patient has been off of telmisartan 20 mg qd since last June. She says her recent blood pressure readings have been a bit high again: systolic of 140s - 160s and diastolic 80s-90s. She describes feeling swimmyheaded this morning. The patient would like to go back on telmisartan. Requests a new refill to be sent to [facility]. Please advise.

## 2020-11-03 NOTE — Telephone Encounter (Signed)
Patient advised.

## 2020-11-26 MED FILL — Nabumetone Tab 750 MG: ORAL | 30 days supply | Qty: 60 | Fill #0 | Status: CN

## 2020-11-27 ENCOUNTER — Other Ambulatory Visit (HOSPITAL_COMMUNITY): Payer: Self-pay

## 2020-12-02 ENCOUNTER — Telehealth: Payer: Self-pay | Admitting: Family Medicine

## 2020-12-02 NOTE — Telephone Encounter (Signed)
Left lumbar trigger point injected today with 3 cc 0.25% bupivacaine and 40 mg depo-medrol.

## 2020-12-05 ENCOUNTER — Other Ambulatory Visit (HOSPITAL_COMMUNITY): Payer: Self-pay

## 2020-12-14 MED FILL — Fluticasone Propionate Nasal Susp 50 MCG/ACT: NASAL | 30 days supply | Qty: 16 | Fill #0 | Status: AC

## 2020-12-15 ENCOUNTER — Other Ambulatory Visit (HOSPITAL_COMMUNITY): Payer: Self-pay

## 2020-12-15 MED FILL — Levonorgestrel & Ethinyl Estradiol (91-Day) Tab 0.15-0.03 MG: ORAL | 91 days supply | Qty: 91 | Fill #0 | Status: AC

## 2020-12-15 MED FILL — Nabumetone Tab 750 MG: ORAL | 30 days supply | Qty: 60 | Fill #0 | Status: AC

## 2020-12-31 ENCOUNTER — Other Ambulatory Visit (HOSPITAL_COMMUNITY): Payer: Self-pay

## 2020-12-31 MED FILL — Bupropion HCl Tab ER 24HR 150 MG: ORAL | 90 days supply | Qty: 90 | Fill #0 | Status: AC

## 2021-01-17 ENCOUNTER — Telehealth: Payer: 59 | Admitting: Orthopedic Surgery

## 2021-01-17 DIAGNOSIS — R0989 Other specified symptoms and signs involving the circulatory and respiratory systems: Secondary | ICD-10-CM | POA: Diagnosis not present

## 2021-01-17 MED ORDER — AMOXICILLIN-POT CLAVULANATE 875-125 MG PO TABS: 1.0000 | ORAL_TABLET | Freq: Two times a day (BID) | ORAL | 0 refills | Status: AC

## 2021-01-17 NOTE — Progress Notes (Signed)

## 2021-02-02 MED FILL — Telmisartan Tab 20 MG: ORAL | 90 days supply | Qty: 90 | Fill #0 | Status: AC

## 2021-02-03 ENCOUNTER — Other Ambulatory Visit (HOSPITAL_COMMUNITY): Payer: Self-pay

## 2021-02-10 ENCOUNTER — Other Ambulatory Visit: Payer: Self-pay | Admitting: Family Medicine

## 2021-02-10 ENCOUNTER — Other Ambulatory Visit (HOSPITAL_COMMUNITY): Payer: Self-pay

## 2021-02-10 MED ORDER — CIPROFLOXACIN HCL 500 MG PO TABS
500.0000 mg | ORAL_TABLET | Freq: Two times a day (BID) | ORAL | 0 refills | Status: DC
  Filled 2021-02-10: qty 14, 7d supply, fill #0

## 2021-02-10 MED ORDER — FLUCONAZOLE 150 MG PO TABS
150.0000 mg | ORAL_TABLET | ORAL | 0 refills | Status: DC
  Filled 2021-02-10: qty 5, 15d supply, fill #0

## 2021-02-10 NOTE — Progress Notes (Signed)
Has UTI.  Rx sent.

## 2021-03-10 MED FILL — Levonorgestrel & Ethinyl Estradiol (91-Day) Tab 0.15-0.03 MG: ORAL | 91 days supply | Qty: 91 | Fill #1 | Status: AC

## 2021-03-11 ENCOUNTER — Other Ambulatory Visit (HOSPITAL_COMMUNITY): Payer: Self-pay

## 2021-03-20 ENCOUNTER — Other Ambulatory Visit (HOSPITAL_COMMUNITY): Payer: Self-pay

## 2021-03-20 ENCOUNTER — Other Ambulatory Visit: Payer: Self-pay | Admitting: Family

## 2021-03-20 MED ORDER — AZITHROMYCIN 250 MG PO TABS
ORAL_TABLET | ORAL | 0 refills | Status: AC
  Filled 2021-03-20: qty 6, 5d supply, fill #0

## 2021-03-31 ENCOUNTER — Other Ambulatory Visit (HOSPITAL_COMMUNITY): Payer: Self-pay

## 2021-03-31 ENCOUNTER — Other Ambulatory Visit: Payer: Self-pay

## 2021-03-31 MED ORDER — METHYLPREDNISOLONE 4 MG PO TBPK
ORAL_TABLET | ORAL | 0 refills | Status: DC
  Filled 2021-03-31: qty 21, 6d supply, fill #0

## 2021-03-31 NOTE — Progress Notes (Signed)
error 

## 2021-04-05 MED FILL — Bupropion HCl Tab ER 24HR 150 MG: ORAL | 90 days supply | Qty: 90 | Fill #1 | Status: AC

## 2021-04-06 ENCOUNTER — Other Ambulatory Visit (HOSPITAL_COMMUNITY): Payer: Self-pay

## 2021-04-28 ENCOUNTER — Other Ambulatory Visit (HOSPITAL_COMMUNITY): Payer: Self-pay

## 2021-04-28 ENCOUNTER — Other Ambulatory Visit: Payer: Self-pay | Admitting: Family

## 2021-04-28 MED ORDER — METHYLPREDNISOLONE 4 MG PO TBPK
ORAL_TABLET | ORAL | 0 refills | Status: DC
  Filled 2021-04-28: qty 21, 6d supply, fill #0

## 2021-05-03 MED FILL — Telmisartan Tab 20 MG: ORAL | 90 days supply | Qty: 90 | Fill #1 | Status: AC

## 2021-05-04 ENCOUNTER — Other Ambulatory Visit (HOSPITAL_COMMUNITY): Payer: Self-pay

## 2021-05-20 DIAGNOSIS — L237 Allergic contact dermatitis due to plants, except food: Secondary | ICD-10-CM | POA: Diagnosis not present

## 2021-05-20 DIAGNOSIS — L720 Epidermal cyst: Secondary | ICD-10-CM | POA: Diagnosis not present

## 2021-06-10 ENCOUNTER — Other Ambulatory Visit (HOSPITAL_COMMUNITY): Payer: Self-pay

## 2021-06-10 MED ORDER — AMOXICILLIN 500 MG PO CAPS
ORAL_CAPSULE | ORAL | 0 refills | Status: DC
  Filled 2021-06-10: qty 21, 7d supply, fill #0

## 2021-06-16 ENCOUNTER — Other Ambulatory Visit (HOSPITAL_COMMUNITY): Payer: Self-pay

## 2021-06-16 ENCOUNTER — Other Ambulatory Visit: Payer: Self-pay | Admitting: Family

## 2021-06-16 MED ORDER — FLUCONAZOLE 150 MG PO TABS
150.0000 mg | ORAL_TABLET | ORAL | 0 refills | Status: DC
  Filled 2021-06-16: qty 5, 15d supply, fill #0

## 2021-06-16 MED FILL — Levonorgestrel & Ethinyl Estradiol (91-Day) Tab 0.15-0.03 MG: ORAL | 91 days supply | Qty: 91 | Fill #2 | Status: AC

## 2021-06-28 MED FILL — Bupropion HCl Tab ER 24HR 150 MG: ORAL | 90 days supply | Qty: 90 | Fill #2 | Status: AC

## 2021-06-29 ENCOUNTER — Other Ambulatory Visit (HOSPITAL_COMMUNITY): Payer: Self-pay

## 2021-07-26 MED FILL — Telmisartan Tab 20 MG: ORAL | 90 days supply | Qty: 90 | Fill #2 | Status: AC

## 2021-07-27 ENCOUNTER — Other Ambulatory Visit (HOSPITAL_COMMUNITY): Payer: Self-pay

## 2021-08-13 ENCOUNTER — Other Ambulatory Visit: Payer: Self-pay | Admitting: Orthopedic Surgery

## 2021-08-13 ENCOUNTER — Other Ambulatory Visit (HOSPITAL_COMMUNITY): Payer: Self-pay

## 2021-08-13 MED ORDER — AZITHROMYCIN 250 MG PO TABS
ORAL_TABLET | ORAL | 0 refills | Status: DC
  Filled 2021-08-13: qty 6, 5d supply, fill #0

## 2021-09-21 ENCOUNTER — Other Ambulatory Visit (HOSPITAL_COMMUNITY): Payer: Self-pay

## 2021-09-21 MED ORDER — BUPROPION HCL ER (XL) 150 MG PO TB24
150.0000 mg | ORAL_TABLET | Freq: Every day | ORAL | 0 refills | Status: DC
  Filled 2021-09-21: qty 90, 90d supply, fill #0

## 2021-09-21 MED FILL — Levonorgestrel & Ethinyl Estradiol (91-Day) Tab 0.15-0.03 MG: ORAL | 91 days supply | Qty: 91 | Fill #3 | Status: AC

## 2021-09-24 ENCOUNTER — Other Ambulatory Visit (HOSPITAL_COMMUNITY): Payer: Self-pay

## 2021-10-12 ENCOUNTER — Other Ambulatory Visit (HOSPITAL_COMMUNITY): Payer: Self-pay

## 2021-10-12 DIAGNOSIS — Z0000 Encounter for general adult medical examination without abnormal findings: Secondary | ICD-10-CM | POA: Diagnosis not present

## 2021-10-12 DIAGNOSIS — Z01419 Encounter for gynecological examination (general) (routine) without abnormal findings: Secondary | ICD-10-CM | POA: Diagnosis not present

## 2021-10-12 DIAGNOSIS — Z01411 Encounter for gynecological examination (general) (routine) with abnormal findings: Secondary | ICD-10-CM | POA: Diagnosis not present

## 2021-10-12 DIAGNOSIS — Z124 Encounter for screening for malignant neoplasm of cervix: Secondary | ICD-10-CM | POA: Diagnosis not present

## 2021-10-12 DIAGNOSIS — Z13220 Encounter for screening for lipoid disorders: Secondary | ICD-10-CM | POA: Diagnosis not present

## 2021-10-12 DIAGNOSIS — Z1329 Encounter for screening for other suspected endocrine disorder: Secondary | ICD-10-CM | POA: Diagnosis not present

## 2021-10-12 DIAGNOSIS — Z309 Encounter for contraceptive management, unspecified: Secondary | ICD-10-CM | POA: Diagnosis not present

## 2021-10-12 DIAGNOSIS — Z131 Encounter for screening for diabetes mellitus: Secondary | ICD-10-CM | POA: Diagnosis not present

## 2021-10-12 DIAGNOSIS — Z113 Encounter for screening for infections with a predominantly sexual mode of transmission: Secondary | ICD-10-CM | POA: Diagnosis not present

## 2021-10-12 LAB — COMPREHENSIVE METABOLIC PANEL
ALBUMIN/GLOBULIN RATIO: 1.6
ALT: 18 (ref 3–30)
AST: 23
Albumin: 4.6
Alkaline Phosphatase: 79
BUN/Creatinine Ratio: 15
BUN: 13
Bilirubin: 0.3
Calcium: 9.1
Carbon Dioxide, Total: 23
Chloride: 102
Creat: 0.87
Globulin, Total: 2.9
Glucose: 80
Potassium: 4.3
Sodium: 139
Total Protein: 7.5 g/dL
eGFR: 87

## 2021-10-12 MED ORDER — LEVONORGEST-ETH ESTRAD 91-DAY 0.15-0.03 MG PO TABS
ORAL_TABLET | ORAL | 3 refills | Status: DC
  Filled 2021-10-12 – 2021-12-29 (×2): qty 91, 91d supply, fill #0
  Filled 2022-03-15 – 2022-03-16 (×2): qty 91, 91d supply, fill #1
  Filled 2022-06-20: qty 91, 91d supply, fill #2
  Filled 2022-09-12: qty 91, 91d supply, fill #3

## 2021-10-12 MED ORDER — TELMISARTAN 20 MG PO TABS
ORAL_TABLET | ORAL | 3 refills | Status: DC
  Filled 2021-10-12: qty 90, 90d supply, fill #0
  Filled 2022-01-18: qty 90, 90d supply, fill #1
  Filled 2022-04-15: qty 90, 90d supply, fill #2
  Filled 2022-07-13: qty 90, 90d supply, fill #3

## 2021-10-12 MED ORDER — BUPROPION HCL ER (XL) 150 MG PO TB24
ORAL_TABLET | ORAL | 3 refills | Status: DC
  Filled 2021-10-12 – 2021-12-28 (×3): qty 90, 90d supply, fill #0
  Filled 2022-03-15: qty 90, 90d supply, fill #1
  Filled 2022-06-20: qty 90, 90d supply, fill #2
  Filled 2022-09-12: qty 90, 90d supply, fill #3

## 2021-10-13 LAB — LIPID PANEL
Cholesterol: 161 (ref 0–200)
HDL: 41 (ref 35–70)
LDL Cholesterol: 102
Triglycerides: 95 (ref 40–160)

## 2021-10-13 LAB — CBC AND DIFFERENTIAL
HCT: 39 (ref 36–46)
Hemoglobin: 12.8 (ref 12.0–16.0)
MCH: 28.5
MCHC: 33.1
MCV: 86 (ref 76–111)
Platelets: 451 10*3/uL — AB (ref 150–400)
RBC: 4.51 (ref 3.87–5.11)
RDW: 14.1
WBC: 11.9

## 2021-10-13 LAB — HEMOGLOBIN A1C: Hemoglobin A1C: 5.7

## 2021-10-13 LAB — TSH: TSH: 0.72 (ref 0.41–5.90)

## 2021-10-13 LAB — CBC: RBC: 4.51 (ref 3.87–5.11)

## 2021-10-20 LAB — HM PAP SMEAR: HM Pap smear: NEGATIVE

## 2021-11-23 ENCOUNTER — Ambulatory Visit (INDEPENDENT_AMBULATORY_CARE_PROVIDER_SITE_OTHER): Payer: 59 | Admitting: Physician Assistant

## 2021-11-23 ENCOUNTER — Other Ambulatory Visit (HOSPITAL_COMMUNITY): Payer: Self-pay

## 2021-11-23 ENCOUNTER — Encounter: Payer: Self-pay | Admitting: Physician Assistant

## 2021-11-23 VITALS — BP 129/75 | HR 72 | Temp 98.0°F | Ht 61.5 in | Wt 190.0 lb

## 2021-11-23 DIAGNOSIS — I10 Essential (primary) hypertension: Secondary | ICD-10-CM | POA: Diagnosis not present

## 2021-11-23 DIAGNOSIS — F419 Anxiety disorder, unspecified: Secondary | ICD-10-CM | POA: Diagnosis not present

## 2021-11-23 DIAGNOSIS — G43109 Migraine with aura, not intractable, without status migrainosus: Secondary | ICD-10-CM

## 2021-11-23 DIAGNOSIS — Z7689 Persons encountering health services in other specified circumstances: Secondary | ICD-10-CM | POA: Diagnosis not present

## 2021-11-23 MED ORDER — ALPRAZOLAM 0.5 MG PO TABS
0.5000 mg | ORAL_TABLET | Freq: Two times a day (BID) | ORAL | 0 refills | Status: DC | PRN
  Filled 2021-11-23: qty 10, 5d supply, fill #0

## 2021-11-23 MED ORDER — RIZATRIPTAN BENZOATE 5 MG PO TABS
5.0000 mg | ORAL_TABLET | ORAL | 1 refills | Status: DC | PRN
  Filled 2021-11-23: qty 18, 30d supply, fill #0

## 2021-11-23 NOTE — Assessment & Plan Note (Signed)
-  BP in office at goal <130/80. Recommend to continue Telmisartan 20 mg daily. Will continue to monitor. ?

## 2021-11-23 NOTE — Patient Instructions (Signed)
Managing Anxiety, Adult ?After being diagnosed with anxiety, you may be relieved to know why you have felt or behaved a certain way. You may also feel overwhelmed about the treatment ahead and what it will mean for your life. With care and support, you can manage this condition. ?How to manage lifestyle changes ?Managing stress and anxiety ?Stress is your body's reaction to life changes and events, both good and bad. Most stress will last just a few hours, but stress can be ongoing and can lead to more than just stress. Although stress can play a major role in anxiety, it is not the same as anxiety. Stress is usually caused by something external, such as a deadline, test, or competition. Stress normally passes after the triggering event has ended.  ?Anxiety is caused by something internal, such as imagining a terrible outcome or worrying that something will go wrong that will devastate you. Anxiety often does not go away even after the triggering event is over, and it can become long-term (chronic) worry. It is important to understand the differences between stress and anxiety and to manage your stress effectively so that it does not lead to an anxious response. ?Talk with your health care provider or a counselor to learn more about reducing anxiety and stress. He or she may suggest tension reduction techniques, such as: ?Music therapy. Spend time creating or listening to music that you enjoy and that inspires you. ?Mindfulness-based meditation. Practice being aware of your normal breaths while not trying to control your breathing. It can be done while sitting or walking. ?Centering prayer. This involves focusing on a word, phrase, or sacred image that means something to you and brings you peace. ?Deep breathing. To do this, expand your stomach and inhale slowly through your nose. Hold your breath for 3-5 seconds. Then exhale slowly, letting your stomach muscles relax. ?Self-talk. Learn to notice and identify  thought patterns that lead to anxiety reactions and change those patterns to thoughts that feel peaceful. ?Muscle relaxation. Taking time to tense muscles and then relax them. ?Choose a tension reduction technique that fits your lifestyle and personality. These techniques take time and practice. Set aside 5-15 minutes a day to do them. Therapists can offer counseling and training in these techniques. The training to help with anxiety may be covered by some insurance plans. ?Other things you can do to manage stress and anxiety include: ?Keeping a stress diary. This can help you learn what triggers your reaction and then learn ways to manage your response. ?Thinking about how you react to certain situations. You may not be able to control everything, but you can control your response. ?Making time for activities that help you relax and not feeling guilty about spending your time in this way. ?Doing visual imagery. This involves imagining or creating mental pictures to help you relax. ?Practicing yoga. Through yoga poses, you can lower tension and promote relaxation. ? ?Medicines ?Medicines can help ease symptoms. Medicines for anxiety include: ?Antidepressant medicines. These are usually prescribed for long-term daily control. ?Anti-anxiety medicines. These may be added in severe cases, especially when panic attacks occur. ?Medicines will be prescribed by a health care provider. When used together, medicines, psychotherapy, and tension reduction techniques may be the most effective treatment. ?Relationships ?Relationships can play a big part in helping you recover. Try to spend more time connecting with trusted friends and family members. ?Consider going to couples counseling if you have a partner, taking family education classes, or going to family   therapy. ?Therapy can help you and others better understand your condition. ?How to recognize changes in your anxiety ?Everyone responds differently to treatment for  anxiety. Recovery from anxiety happens when symptoms decrease and stop interfering with your daily activities at home or work. This may mean that you will start to: ?Have better concentration and focus. Worry will interfere less in your daily thinking. ?Sleep better. ?Be less irritable. ?Have more energy. ?Have improved memory. ?It is also important to recognize when your condition is getting worse. Contact your health care provider if your symptoms interfere with home or work and you feel like your condition is not improving. ?Follow these instructions at home: ?Activity ?Exercise. Adults should do the following: ?Exercise for at least 150 minutes each week. The exercise should increase your heart rate and make you sweat (moderate-intensity exercise). ?Strengthening exercises at least twice a week. ?Get the right amount and quality of sleep. Most adults need 7-9 hours of sleep each night. ?Lifestyle ? ?Eat a healthy diet that includes plenty of vegetables, fruits, whole grains, low-fat dairy products, and lean protein. ?Do not eat a lot of foods that are high in fats, added sugars, or salt (sodium). ?Make choices that simplify your life. ?Do not use any products that contain nicotine or tobacco. These products include cigarettes, chewing tobacco, and vaping devices, such as e-cigarettes. If you need help quitting, ask your health care provider. ?Avoid caffeine, alcohol, and certain over-the-counter cold medicines. These may make you feel worse. Ask your pharmacist which medicines to avoid. ?General instructions ?Take over-the-counter and prescription medicines only as told by your health care provider. ?Keep all follow-up visits. This is important. ?Where to find support ?You can get help and support from these sources: ?Self-help groups. ?Online and community organizations. ?A trusted spiritual leader. ?Couples counseling. ?Family education classes. ?Family therapy. ?Where to find more information ?You may find  that joining a support group helps you deal with your anxiety. The following sources can help you locate counselors or support groups near you: ?Mental Health America: www.mentalhealthamerica.net ?Anxiety and Depression Association of America (ADAA): www.adaa.org ?National Alliance on Mental Illness (NAMI): www.nami.org ?Contact a health care provider if: ?You have a hard time staying focused or finishing daily tasks. ?You spend many hours a day feeling worried about everyday life. ?You become exhausted by worry. ?You start to have headaches or frequently feel tense. ?You develop chronic nausea or diarrhea. ?Get help right away if: ?You have a racing heart and shortness of breath. ?You have thoughts of hurting yourself or others. ?If you ever feel like you may hurt yourself or others, or have thoughts about taking your own life, get help right away. Go to your nearest emergency department or: ?Call your local emergency services (911 in the [place] ?Call a suicide crisis helpline, such as the National Suicide Prevention Lifeline at 1-800-273-8255 or 988 in the [place] This is open 24 hours a day in the [place] ?Text the Crisis Text Line at 741741 (in the [place] ?Summary ?Taking steps to learn and use tension reduction techniques can help calm you and help prevent triggering an anxiety reaction. ?When used together, medicines, psychotherapy, and tension reduction techniques may be the most effective treatment. ?Family, friends, and partners can play a big part in supporting you. ?This information is not intended to replace advice given to you by your health care provider. Make sure you discuss any questions you have with your health care provider. ?Document Revised: 03/04/2021 Document Reviewed: 11/30/2020 ?Elsevier Patient   Education ? 2022 Elsevier Inc. ? ?

## 2021-11-23 NOTE — Assessment & Plan Note (Signed)
-  Stable.  ?-Recommend to continue Wellbutrin XL 150 mg.  ?-Discussed with patient the office policy for controlled substance contract for benzodiazapine. PDMP reviewed, alprazolam 0.5 mg last filled 02/29/2020 Q: 10 tablets which correlates with patient's use history. Provided refill. Plan to obtain controlled substance contract at f/up visit.  ?-Will continue to monitor. ?

## 2021-11-23 NOTE — Progress Notes (Signed)
? ?New Patient Office Visit ? ?Subjective:  ?Patient ID: Andrea Love, female    DOB: [birth year]  Age: 41 y.o. MRN: [id number] ? ?CC:  ?Chief Complaint  ?Patient presents with  ? New Patient (Initial Visit)  ? ? ?HPI ?Andrea Love presents to establish care. Patient has a past medical history of hypertension and anxiety. Takes telmisartan 20 mg daily for high blood pressure and Wellbutrin XL 150 mg daily for anxiety. States her blood pressure runs on average like today's BP. No chest pain, palpitations or dizziness. Reports her anxiety has been stable with medication, occasionally takes alprazolam 0.5 mg as needed for severe anxiety/panic attack. States has still few pills left from last prescription she got filled. Takes Maxalt about once every 3 months for an acute migraine which are triggered when she takes placebo for OCP. Also takes Claritin for seasonal allergies and Vitamin D supplement. Patient states has a prior hx of gestational diabetes. Patient has never been a smoker, no alcohol or substance use. Walks 30 minutes 3-4 times per week.  ? ? ? ?Past Medical History:  ?Diagnosis Date  ? Diabetes mellitus without complication (HCC)   ? Hypertension   ? Migraines   ? ? ?Past Surgical History:  ?Procedure Laterality Date  ? CESAREAN SECTION  10/13/2009  ? MOUTH SURGERY  12/2006  ? wisdom teeth  ? ? ?Family History  ?Problem Relation Age of Onset  ? Hypertension Mother   ? Diabetes Mother   ? Hypertension Father   ? Hyperlipidemia Father   ? Other Father   ?     Cerebral Ataxia  ? Breast cancer Maternal Grandmother   ? Bone cancer Maternal Grandmother   ? Stroke Maternal Grandfather   ? Seizures Maternal Grandfather   ? Cancer Paternal Grandmother   ?     Brain Tumor  ? Diabetes Paternal Grandfather   ? Prostate cancer Paternal Grandfather   ? Heart attack Paternal Grandfather   ? ? ?Social History  ? ?Socioeconomic History  ? Marital status: Married  ?  Spouse name: Not on file  ? Number of children: 1  ?  Years of education: Not on file  ? Highest education level: Not on file  ?Occupational History  ? Not on file  ?Tobacco Use  ? Smoking status: Never  ? Smokeless tobacco: Never  ?Substance and Sexual Activity  ? Alcohol use: Yes  ?  Alcohol/week: 0.5 standard drinks  ?  Types: 1 Standard drinks or equivalent per week  ? Drug use: No  ? Sexual activity: Not on file  ?Other Topics Concern  ? Not on file  ?Social History Narrative  ? Not on file  ? ?Social Determinants of Health  ? ?Financial Resource Strain: Not on file  ?Food Insecurity: Not on file  ?Transportation Needs: Not on file  ?Physical Activity: Not on file  ?Stress: Not on file  ?Social Connections: Not on file  ?Intimate Partner Violence: Not on file  ? ? ? ?ROS ?Review of Systems ?Review of Systems:  ?A fourteen system review of systems was performed and found to be positive as per HPI. ? ?Objective:  ? ?Today's Vitals: BP 129/75   Pulse 72   Temp 98 ?F (36.7 ?C)   Ht 5' 1.5" (1.562 m)   Wt 190 lb (86.2 kg)   SpO2 98%   BMI 35.32 kg/m?  ? ?Physical Exam ?General:  Well Developed, well nourished, appropriate for stated age.  ?Neuro:  Alert  and oriented,  extra-ocular muscles intact  ?HEENT:  Normocephalic, atraumatic, neck supple  ?Skin:  no gross rash, warm, pink. ?Cardiac:  RRR, S1 S2 ?Respiratory: CTA B/L  ?Vascular:  Ext warm, no cyanosis apprec.; cap RF less 2 sec. ?Psych:  No HI/SI, judgement and insight good, Euthymic mood. Full Affect. ? ?Assessment & Plan:  ? ?Problem List Items Addressed This Visit   ? ?  ? Cardiovascular and Mediastinum  ? Primary hypertension  ?  -BP in office at goal <130/80. Recommend to continue Telmisartan 20 mg daily. Will continue to monitor. ?  ?  ? Migraine with aura and without status migrainosus, not intractable  ?  -Stable. ?-Continue Maxalt as needed for acute headache. ?-Will continue to monitor. ?  ?  ? Relevant Medications  ? rizatriptan (MAXALT) 5 MG tablet  ?  ? Other  ? Anxiety  ?  -Stable.  ?-Recommend  to continue Wellbutrin XL 150 mg.  ?-Discussed with patient the office policy for controlled substance contract for benzodiazapine. PDMP reviewed, alprazolam 0.5 mg last filled 02/29/2020 Q: 10 tablets which correlates with patient's use history. Provided refill. Plan to obtain controlled substance contract at f/up visit.  ?-Will continue to monitor. ?  ?  ? Relevant Medications  ? ALPRAZolam (XANAX) 0.5 MG tablet  ? ?Other Visit Diagnoses   ? ? Encounter to establish care    -  Primary  ? ?  ?  ?Encounter to establish care: ?-Will request most recent labs and records from OB/GYN (Dr. Juliene Pina). ? ? ?Outpatient Encounter Medications as of 11/23/2021  ?Medication Sig  ? buPROPion (WELLBUTRIN XL) 150 MG 24 hr tablet Take 1 tablet by mouth every day  ? Cholecalciferol (VITAMIN D) 50 MCG (2000 UT) CAPS Take by mouth.  ? fluconazole (DIFLUCAN) 150 MG tablet Take 1 tablet (150 mg total) by mouth every 3 (three) days.  ? JOLESSA 0.15-0.03 MG tablet Take 1 tablet by mouth daily.  ? telmisartan (MICARDIS) 20 MG tablet Take 1 tablet by mouth every day  ? [DISCONTINUED] ALPRAZolam (XANAX) 0.5 MG tablet Take 1 tablet (0.5 mg total) by mouth at bedtime as needed for anxiety. For MRI scan  ? [DISCONTINUED] rizatriptan (MAXALT) 5 MG tablet Take 5 mg by mouth as needed for migraine. May repeat in 2 hours if needed  ? ALPRAZolam (XANAX) 0.5 MG tablet Take 1 tablet  by mouth 2  times daily as needed for anxiety. For MRI scan  ? fluticasone (FLONASE) 50 MCG/ACT nasal spray PLACE 2 SPRAYS INTO BOTH NOSTRILS DAILY.  ? rizatriptan (MAXALT) 5 MG tablet Take 1 tablet  by mouth as needed for migraine. May repeat in 2 hours if needed  ? [DISCONTINUED] amoxicillin (AMOXIL) 500 MG capsule Take 1 capsule by mouth three times daily until gone.  ? [DISCONTINUED] azithromycin (ZITHROMAX Z-PAK) 250 MG tablet Take 2 tablets by mouth day one then take 1 tablet daily on days two thru five.  ? [DISCONTINUED] buPROPion (WELLBUTRIN XL) 150 MG 24 hr tablet TAKE  1 TABLET BY MOUTH ONCE DAILY  ? [DISCONTINUED] ipratropium (ATROVENT) 0.06 % nasal spray Place 2 sprays into both nostrils 3 (three) times daily.  ? [DISCONTINUED] levonorgestrel-ethinyl estradiol (SEASONALE) 0.15-0.03 MG tablet TAKE 1 TABLET BY MOUTH ONCE A DAY  ? [DISCONTINUED] levonorgestrel-ethinyl estradiol (SETLAKIN) 0.15-0.03 MG tablet Take 1 tablet by mouth daily  ? ?No facility-administered encounter medications on file as of 11/23/2021.  ? ? ?Follow-up: Return in about 6 months (around 05/25/2022) for HTN, mood.  ? ?  Lorrene Reid, PA-C ? ?

## 2021-11-23 NOTE — Assessment & Plan Note (Signed)
-  Stable. ?-Continue Maxalt as needed for acute headache. ?-Will continue to monitor. ?

## 2021-11-24 ENCOUNTER — Other Ambulatory Visit (HOSPITAL_COMMUNITY): Payer: Self-pay

## 2021-11-24 ENCOUNTER — Other Ambulatory Visit: Payer: Self-pay | Admitting: Physical Medicine and Rehabilitation

## 2021-11-24 MED ORDER — CIPROFLOXACIN HCL 250 MG PO TABS
250.0000 mg | ORAL_TABLET | Freq: Two times a day (BID) | ORAL | 0 refills | Status: AC
  Filled 2021-11-24: qty 15, 8d supply, fill #0

## 2021-12-06 ENCOUNTER — Telehealth: Payer: 59 | Admitting: Physician Assistant

## 2021-12-06 DIAGNOSIS — J020 Streptococcal pharyngitis: Secondary | ICD-10-CM

## 2021-12-06 MED ORDER — AMOXICILLIN 500 MG PO CAPS
500.0000 mg | ORAL_CAPSULE | Freq: Two times a day (BID) | ORAL | 0 refills | Status: AC
  Filled 2021-12-06: qty 20, 10d supply, fill #0

## 2021-12-06 NOTE — Progress Notes (Signed)

## 2021-12-07 ENCOUNTER — Other Ambulatory Visit (HOSPITAL_COMMUNITY): Payer: Self-pay

## 2021-12-09 ENCOUNTER — Encounter: Payer: Self-pay | Admitting: Physician Assistant

## 2021-12-28 ENCOUNTER — Other Ambulatory Visit: Payer: Self-pay

## 2021-12-28 ENCOUNTER — Other Ambulatory Visit (HOSPITAL_COMMUNITY): Payer: Self-pay

## 2021-12-29 ENCOUNTER — Other Ambulatory Visit (HOSPITAL_COMMUNITY): Payer: Self-pay

## 2021-12-31 ENCOUNTER — Other Ambulatory Visit (HOSPITAL_COMMUNITY): Payer: Self-pay

## 2022-01-19 ENCOUNTER — Other Ambulatory Visit (HOSPITAL_COMMUNITY): Payer: Self-pay

## 2022-03-15 ENCOUNTER — Ambulatory Visit: Payer: Self-pay

## 2022-03-15 ENCOUNTER — Other Ambulatory Visit: Payer: Self-pay

## 2022-03-15 ENCOUNTER — Other Ambulatory Visit: Payer: Self-pay | Admitting: Specialist

## 2022-03-15 ENCOUNTER — Other Ambulatory Visit (HOSPITAL_COMMUNITY): Payer: Self-pay

## 2022-03-15 DIAGNOSIS — M542 Cervicalgia: Secondary | ICD-10-CM

## 2022-03-15 MED ORDER — METHYLPREDNISOLONE 4 MG PO TBPK
ORAL_TABLET | ORAL | 0 refills | Status: DC
  Filled 2022-03-15: qty 21, 6d supply, fill #0

## 2022-03-15 MED ORDER — METAXALONE 400 MG PO TABS
400.0000 mg | ORAL_TABLET | Freq: Four times a day (QID) | ORAL | 0 refills | Status: DC
  Filled 2022-03-15: qty 30, 8d supply, fill #0

## 2022-03-16 ENCOUNTER — Other Ambulatory Visit (HOSPITAL_COMMUNITY): Payer: Self-pay

## 2022-03-16 ENCOUNTER — Other Ambulatory Visit: Payer: Self-pay | Admitting: Specialist

## 2022-03-16 MED ORDER — CYCLOBENZAPRINE HCL 5 MG PO TABS
10.0000 mg | ORAL_TABLET | Freq: Three times a day (TID) | ORAL | 1 refills | Status: AC | PRN
  Filled 2022-03-16: qty 30, 5d supply, fill #0
  Filled 2022-07-13: qty 30, 10d supply, fill #1

## 2022-03-17 ENCOUNTER — Other Ambulatory Visit (HOSPITAL_COMMUNITY): Payer: Self-pay

## 2022-04-05 ENCOUNTER — Other Ambulatory Visit: Payer: Self-pay | Admitting: Physical Medicine and Rehabilitation

## 2022-04-05 ENCOUNTER — Other Ambulatory Visit (HOSPITAL_COMMUNITY): Payer: Self-pay

## 2022-04-05 MED ORDER — TIZANIDINE HCL 4 MG PO TABS
4.0000 mg | ORAL_TABLET | Freq: Four times a day (QID) | ORAL | 0 refills | Status: DC | PRN
  Filled 2022-04-05: qty 30, 8d supply, fill #0

## 2022-04-15 ENCOUNTER — Other Ambulatory Visit (HOSPITAL_COMMUNITY): Payer: Self-pay

## 2022-05-04 ENCOUNTER — Telehealth: Payer: 59 | Admitting: Physician Assistant

## 2022-05-04 ENCOUNTER — Other Ambulatory Visit (HOSPITAL_COMMUNITY): Payer: Self-pay

## 2022-05-04 DIAGNOSIS — N760 Acute vaginitis: Secondary | ICD-10-CM

## 2022-05-04 DIAGNOSIS — B9689 Other specified bacterial agents as the cause of diseases classified elsewhere: Secondary | ICD-10-CM

## 2022-05-04 MED ORDER — METRONIDAZOLE 500 MG PO TABS
500.0000 mg | ORAL_TABLET | Freq: Two times a day (BID) | ORAL | 0 refills | Status: DC
  Filled 2022-05-04: qty 14, 7d supply, fill #0

## 2022-05-04 NOTE — Progress Notes (Signed)
I have spent 5 minutes in review of e-visit questionnaire, review and updating patient chart, medical decision making and response to patient.   Tenesia Escudero Cody Bralynn Donado, PA-C    

## 2022-05-04 NOTE — Progress Notes (Signed)
E-Visit for Vaginal Symptoms  We are sorry that you are not feeling well. Here is how we plan to help! Based on what you shared with me it looks like you: May have a vaginosis due to bacteria  Vaginosis is an inflammation of the vagina that can result in discharge, itching and pain. The cause is usually a change in the normal balance of vaginal bacteria or an infection. Vaginosis can also result from reduced estrogen levels after menopause.  The most common causes of vaginosis are:   Bacterial vaginosis which results from an overgrowth of one on several organisms that are normally present in your vagina.   Yeast infections which are caused by a naturally occurring fungus called candida.   Vaginal atrophy (atrophic vaginosis) which results from the thinning of the vagina from reduced estrogen levels after menopause.   Trichomoniasis which is caused by a parasite and is commonly transmitted by sexual intercourse.  Factors that increase your risk of developing vaginosis include: Medications, such as antibiotics and steroids Uncontrolled diabetes Use of hygiene products such as bubble bath, vaginal spray or vaginal deodorant Douching Wearing damp or tight-fitting clothing Using an intrauterine device (IUD) for birth control Hormonal changes, such as those associated with pregnancy, birth control pills or menopause Sexual activity Having a sexually transmitted infection  Your treatment plan is Metronidazole or Flagyl 500mg  twice a day for 7 days.  I have electronically sent this prescription into the pharmacy that you have chosen.  If not completely resolving with this, you will require and in office evaluation for exam and testing.   Be sure to take all of the medication as directed. Stop taking any medication if you develop a rash, tongue swelling or shortness of breath. Mothers who are breast feeding should consider pumping and discarding their breast milk while on these antibiotics.  However, there is no consensus that infant exposure at these doses would be harmful.  Remember that medication creams can weaken latex condoms.   HOME CARE:  Good hygiene may prevent some types of vaginosis from recurring and may relieve some symptoms:  Avoid baths, hot tubs and whirlpool spas. Rinse soap from your outer genital area after a shower, and dry the area well to prevent irritation. Don't use scented or harsh soaps, such as those with deodorant or antibacterial action. Avoid irritants. These include scented tampons and pads. Wipe from front to back after using the toilet. Doing so avoids spreading fecal bacteria to your vagina.  Other things that may help prevent vaginosis include:  Don't douche. Your vagina doesn't require cleansing other than normal bathing. Repetitive douching disrupts the normal organisms that reside in the vagina and can actually increase your risk of vaginal infection. Douching won't clear up a vaginal infection. Use a latex condom. Both female and female latex condoms may help you avoid infections spread by sexual contact. Wear cotton underwear. Also wear pantyhose with a cotton crotch. If you feel comfortable without it, skip wearing underwear to bed. Yeast thrives in [place] Your symptoms should improve in the next day or two.  GET HELP RIGHT AWAY IF:  You have pain in your lower abdomen ( pelvic area or over your ovaries) You develop nausea or vomiting You develop a fever Your discharge changes or worsens You have persistent pain with intercourse You develop shortness of breath, a rapid pulse, or you faint.  These symptoms could be signs of problems or infections that need to be evaluated by a medical  provider now.  MAKE SURE YOU   Understand these instructions. Will watch your condition. Will get help right away if you are not doing well or get worse.  Thank you for choosing an e-visit.  Your e-visit answers were reviewed by a  board certified advanced clinical practitioner to complete your personal care plan. Depending upon the condition, your plan could have included both over the counter or prescription medications.  Please review your pharmacy choice. Make sure the pharmacy is open so you can pick up prescription now. If there is a problem, you may contact your provider through [employer] and have the prescription routed to another pharmacy.  Your safety is important to [place]. If you have drug allergies check your prescription carefully.   For the next 24 hours you can use MyChart to ask questions about today's visit, request a non-urgent call back, or ask for a work or school excuse. You will get an email in the next two days asking about your experience. I hope that your e-visit has been valuable and will speed your recovery.

## 2022-05-06 ENCOUNTER — Ambulatory Visit (INDEPENDENT_AMBULATORY_CARE_PROVIDER_SITE_OTHER): Payer: 59 | Admitting: Surgery

## 2022-05-06 ENCOUNTER — Ambulatory Visit: Payer: Self-pay

## 2022-05-06 ENCOUNTER — Other Ambulatory Visit: Payer: Self-pay

## 2022-05-06 DIAGNOSIS — M659 Synovitis and tenosynovitis, unspecified: Secondary | ICD-10-CM

## 2022-05-06 DIAGNOSIS — Q688 Other specified congenital musculoskeletal deformities: Secondary | ICD-10-CM

## 2022-05-06 DIAGNOSIS — M25571 Pain in right ankle and joints of right foot: Secondary | ICD-10-CM

## 2022-05-13 DIAGNOSIS — M25571 Pain in right ankle and joints of right foot: Secondary | ICD-10-CM | POA: Diagnosis not present

## 2022-05-13 DIAGNOSIS — M659 Synovitis and tenosynovitis, unspecified: Secondary | ICD-10-CM | POA: Diagnosis not present

## 2022-05-13 MED ORDER — LIDOCAINE HCL 1 % IJ SOLN
1.0000 mL | INTRAMUSCULAR | Status: AC | PRN
  Administered 2022-05-13: 1 mL

## 2022-05-13 MED ORDER — BUPIVACAINE HCL 0.5 % IJ SOLN
3.0000 mL | INTRAMUSCULAR | Status: AC | PRN
  Administered 2022-05-13: 3 mL via INTRA_ARTICULAR

## 2022-05-13 MED ORDER — METHYLPREDNISOLONE ACETATE 40 MG/ML IJ SUSP
40.0000 mg | INTRAMUSCULAR | Status: AC | PRN
  Administered 2022-05-13: 40 mg via INTRA_ARTICULAR

## 2022-05-13 NOTE — Progress Notes (Signed)
Office Visit Note   Patient: Andrea Love           Date of Birth: 1981/03/27           MRN: [id number] Visit Date: 05/06/2022              Requested by: Mayer Masker, PA-C [address] [place],  [place] [address] PCP: Mayer Masker, PA-C   Assessment & Plan: Visit Diagnoses:  1. Pain in right ankle and joints of right foot   2. Synovitis of right ankle     Plan: In hopes of giving her some improvement of her ankle pain offered injection.  Patient consent right anterolateral ankle was prepped with Betadine and intra-articular Marcaine/Depo-Medrol injection was performed.  Tolerated without complication.  Follow-up as needed.  I will check on her over the next several days while in the clinic.  Let me know if there are any issues.  Advised that if she does not have long-term improvement I may consider getting blood work to check a CBC and arthritis panel.  Follow-Up Instructions: No follow-ups on file.   Orders:  No orders of the defined types were placed in this encounter.  No orders of the defined types were placed in this encounter.     Procedures: Medium Joint Inj: R ankle on 05/13/2022 11:21 PM Indications: pain Details: 25 G 1.5 in needle, anterolateral approach Medications: 1 mL lidocaine 1 %; 3 mL bupivacaine 0.5 %; 40 mg methylPREDNISolone acetate 40 MG/ML Outcome: tolerated well, no immediate complications  After sitting for few minutes she had good improvement of her pain with anesthetic in place. Consent was given by the patient. Patient was prepped and draped in the usual sterile fashion.       Clinical Data: No additional findings.   Subjective: No chief complaint on file.   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  Pain worsening last several days and increased with ambulation.  No injury.  No instability. Review of Systems No current complaints of cardiopulmonary GI/GU issues  Objective: Vital  Signs: There were no vitals taken for this visit.  Physical Exam Pleasant female alert and oriented in no acute distress.  Gait is somewhat antalgic.  Right ankle does have some swelling but not extreme.  Tender along the anterior tibiotalar joint line.  Ankle ligaments are stable.  No signs of infection. Ortho Exam  Specialty Comments:  No specialty comments available.  Imaging: No results found.   PMFS History: Patient Active Problem List   Diagnosis Date Noted   Anxiety 11/23/2021   Primary hypertension 11/23/2021   Migraine with aura and without status migrainosus, not intractable 11/23/2021   Past Medical History:  Diagnosis Date   Diabetes mellitus without complication (HCC)    Hypertension    Migraines     Family History  Problem Relation Age of Onset   Hypertension Mother    Diabetes Mother    Hypertension Father    Hyperlipidemia Father    Other Father        Cerebral Ataxia   Breast cancer Maternal Grandmother    Bone cancer Maternal Grandmother    Stroke Maternal Grandfather    Seizures Maternal Grandfather    Cancer Paternal Grandmother        Brain Tumor   Diabetes Paternal Grandfather    Prostate cancer Paternal Grandfather    Heart attack Paternal Grandfather     Past Surgical History:  Procedure Laterality Date   CESAREAN SECTION  10/13/2009   MOUTH SURGERY  12/2006   wisdom teeth   Social History   Occupational History   Not on file  Tobacco Use   Smoking status: Never   Smokeless tobacco: Never  Substance and Sexual Activity   Alcohol use: Yes    Alcohol/week: 0.5 standard drinks of alcohol    Types: 1 Standard drinks or equivalent per week   Drug use: No   Sexual activity: Not on file

## 2022-05-25 ENCOUNTER — Encounter: Payer: Self-pay | Admitting: Physician Assistant

## 2022-05-25 ENCOUNTER — Ambulatory Visit (INDEPENDENT_AMBULATORY_CARE_PROVIDER_SITE_OTHER): Payer: 59 | Admitting: Physician Assistant

## 2022-05-25 VITALS — BP 123/85 | HR 82 | Wt 186.4 lb

## 2022-05-25 DIAGNOSIS — F419 Anxiety disorder, unspecified: Secondary | ICD-10-CM | POA: Diagnosis not present

## 2022-05-25 DIAGNOSIS — R61 Generalized hyperhidrosis: Secondary | ICD-10-CM | POA: Diagnosis not present

## 2022-05-25 DIAGNOSIS — I10 Essential (primary) hypertension: Secondary | ICD-10-CM

## 2022-05-25 DIAGNOSIS — E236 Other disorders of pituitary gland: Secondary | ICD-10-CM

## 2022-05-25 NOTE — Assessment & Plan Note (Signed)
-  Stable.  -Continue current medication regimen. -Will collect CMP for medication monitoring. 

## 2022-05-25 NOTE — Patient Instructions (Signed)
Hyperhidrosis Hyperhidrosis is a condition in which the body sweats a lot more than normal (excessively). Sweating is a necessary function for a human body. It is normal to sweat when you are hot, physically active, or anxious. However, hyperhidrosis is sweating to an excessive degree. Although the condition is not a serious one, it can cause embarrassment. There are two kinds of hyperhidrosis: Primary hyperhidrosis. The sweating usually localizes in one part of your body, such as your underarms, or in a few areas, such as your feet, face, underarms, and hands. This is the more common kind of hyperhidrosis. Secondary hyperhidrosis. This type usually affects your entire body. What are the causes? The cause of this condition depends on the kind of hyperhidrosis that you have. Primary hyperhidrosis may be caused by sweat glands that are more active than normal. Secondary hyperhidrosis may be caused by an underlying condition or by taking certain medicines, such as antidepressants or diabetes medicines. Possible conditions that may cause secondary hyperhidrosis include: Diabetes. Hot flashes during menopause. Certain types of cancers. Obesity. Overactive thyroid (hyperthyroidism). Nervous system injury or disorders. What increases the risk? You are more likely to develop primary hyperhidrosis if you have a family history of the condition. What are the signs or symptoms? Symptoms of this condition include: Feeling like you are sweating constantly, even while you are not being active. Having skin that peels or gets paler or softer in the areas where you sweat the most. Being able to see sweat on your skin. Other symptoms depend on the kind of hyperhidrosis that you have. Symptoms of primary hyperhidrosis may include: Sweating in the same location on both sides of your body. Sweating only during the day and not while you are sleeping. Sweating in specific areas, such as your underarms, palms,  feet, and face. Symptoms of secondary hyperhidrosis may include: Sweating all over your body. Sweating even while you sleep. How is this diagnosed? This condition may be diagnosed by: Medical history. Physical exam. You may also have other tests, including: Tests to measure the amount of sweat you produce and to show the areas where you sweat the most. These tests may involve: Using color-changing chemicals to show patterns of sweating on the skin. Weighing paper that has been applied to the skin. This will show the amount of sweat that your body produces. Measuring the amount of water that evaporates from the skin. Using infrared technology to show patterns of sweating on the skin. Tests to check for other conditions that may be causing excess sweating. These tests may include blood, urine, or imaging tests. How is this treated? Treatment for this condition depends on the kind of hyperhidrosis that you have and the areas of your body that are affected. Your health care provider will also treat any underlying conditions. Treatment may include: Medicines, such as: Antiperspirants. These are medicines that stop sweat. Injectable medicines. These may include small injections of botulinum toxin. Oral medicines. These are taken by mouth to treat underlying conditions and other symptoms. A procedure to: Temporarily turn off the sweat glands in your hands and feet (iontophoresis). Remove your sweat glands. Cut or destroy the nerves so that they do not send a signal to the sweat glands (sympathectomy). Follow these instructions at home: Lifestyle  Limit or avoid foods or beverages that may increase your risk of sweating, such as: Spicy food. Caffeine. Alcohol. Foods that contain monosodium glutamate (MSG). If your feet sweat: Wear sandals when possible. Do not wear cotton socks. Wear socks  that remove or wick moisture from your feet. Wear leather shoes. Avoid wearing the same pair of  shoes for two days in a row. Try placing sweat pads under your clothes to prevent underarm sweat from showing. Keep a journal of your sweat symptoms and when they occur. This may help you identify things that trigger your sweating. General instructions Take over-the-counter and prescription medicines only as told by your health care provider. Use antiperspirants as told by your health care provider. Consider joining a hyperhidrosis support group. Where to find more information [employer]: MemberVerification.ca Contact a health care provider if: You have new symptoms. Your symptoms get worse. Summary Hyperhidrosis is a condition in which the body sweats a lot more than normal. With primary hyperhidrosis, the sweating usually localizes in one part of your body, such as your underarms, or in a few areas, such as your feet, face, underarms, and hands. It is caused by overactive sweat glands in the affected area. With secondary hyperhidrosis, the sweating affects your entire body. This is caused by an underlying condition or by taking certain medicines. Treatment for this condition depends on the kind of hyperhidrosis that you have and the parts of your body that are affected. This information is not intended to replace advice given to you by your health care provider. Make sure you discuss any questions you have with your health care provider. Document Revised: 10/06/2021 Document Reviewed: 10/06/2021 Elsevier Patient Education  [place].

## 2022-05-25 NOTE — Assessment & Plan Note (Signed)
-  Stable. Continue current medication regimen. Recommend obtaining controlled substance contract for alprazolam 0.5 mg BID as needed for severe anxiety Q:10 tablets.

## 2022-05-25 NOTE — Progress Notes (Signed)
Established patient visit   Patient: Andrea Love   DOB: 08/13/1981   41 y.o. Female  MRN: [id number] Visit Date: 05/25/2022  Chief Complaint  Patient presents with   Follow-up   Subjective    HPI  Patient presents for chronic follow-up. Patient reports noticing more frequently excessive body sweating which she has been a chronic problem. States she will be sitting and will start sweating out of nowhere. Sometimes does have night sweats. No palpitations, cough, fever or chest pain. Does have heat and cold intolerance.   HTN: Pt denies chest pain, palpitations, dizziness or lower extremity swelling. Taking medication as directed without side effects. Checks BP at work  and readings range <130/85.   Mood: Reports her mood has been stable. Taking Wellbutrin XL 150 mg without issues. Takes alprazolam seldomly.       05/25/2022    8:17 AM 11/23/2021    9:21 AM  Depression screen PHQ 2/9  Decreased Interest 0 0  Down, Depressed, Hopeless 0 0  PHQ - 2 Score 0 0  Altered sleeping 1 1  Tired, decreased energy 1 1  Change in appetite 0 0  Feeling bad or failure about yourself  0 0  Trouble concentrating 0 1  Moving slowly or fidgety/restless 0 0  Suicidal thoughts 0 0  PHQ-9 Score 2 3  Difficult doing work/chores  Not difficult at all      05/25/2022    8:17 AM 11/23/2021    9:21 AM  GAD 7 : Generalized Anxiety Score  Nervous, Anxious, on Edge 0 1  Control/stop worrying 0 0  Worry too much - different things 0 1  Trouble relaxing 1 1  Restless 1 3  Easily annoyed or irritable 1 1  Afraid - awful might happen 0 0  Total GAD 7 Score 3 7  Anxiety Difficulty  Somewhat difficult        Medications: Outpatient Medications Prior to Visit  Medication Sig   ALPRAZolam (XANAX) 0.5 MG tablet Take 1 tablet  by mouth 2  times daily as needed for anxiety. For MRI scan   buPROPion (WELLBUTRIN XL) 150 MG 24 hr tablet Take 1 tablet by mouth every day   Cholecalciferol (VITAMIN D) 50  MCG (2000 UT) CAPS Take by mouth.   cyclobenzaprine (FLEXERIL) 5 MG tablet Take 2 tablets by mouth 3 times daily as needed for muscle spasms.   JOLESSA 0.15-0.03 MG tablet Take 1 tablet by mouth daily.   levonorgestrel-ethinyl estradiol (SETLAKIN) 0.15-0.03 MG tablet Take 1 tablet by mouth daily   methylPREDNISolone (MEDROL DOSEPAK) 4 MG TBPK tablet Take as directed on package   metroNIDAZOLE (FLAGYL) 500 MG tablet Take 1 tablet (500 mg total) by mouth 2 (two) times daily.   rizatriptan (MAXALT) 5 MG tablet Take 1 tablet  by mouth as needed for migraine. May repeat in 2 hours if needed   telmisartan (MICARDIS) 20 MG tablet Take 1 tablet by mouth every day   tiZANidine (ZANAFLEX) 4 MG tablet Take 1 tablet (4 mg total) by mouth every 6 (six) hours as needed for muscle spasms.   fluticasone (FLONASE) 50 MCG/ACT nasal spray PLACE 2 SPRAYS INTO BOTH NOSTRILS DAILY.   [DISCONTINUED] fluconazole (DIFLUCAN) 150 MG tablet Take 1 tablet (150 mg total) by mouth every 3 (three) days.   No facility-administered medications prior to visit.    Review of Systems Review of Systems:  A fourteen system review of systems was performed and found to be positive as  per HPI.  Last CBC Lab Results  Component Value Date   WBC 11.9 10/13/2021   HGB 12.8 10/13/2021   HCT 39 10/13/2021   MCV 86.0 10/13/2021   MCH 28.5 10/13/2021   RDW 14.1 10/13/2021   PLT 451 (A) 93/23/5573   Last metabolic panel Lab Results  Component Value Date   GLUCOSE 98 08/13/2019   NA 139 10/12/2021   K 4.3 10/12/2021   CL 102 10/12/2021   CO2 23 10/12/2021   BUN 13 10/12/2021   CREATININE 0.87 10/12/2021   EGFR 87 10/12/2021   CALCIUM 9.1 10/12/2021   PROT 7.5 10/12/2021   ALBUMIN 4.6 10/12/2021   LABGLOB 2.9 10/12/2021   BILITOT 0.3 08/13/2019   ALKPHOS 79 10/12/2021   AST 23 10/12/2021   ALT 18 10/12/2021   ANIONGAP 10 08/13/2019   Last lipids Lab Results  Component Value Date   CHOL 161 10/13/2021   HDL 41  10/13/2021   LDLCALC 102 10/13/2021   TRIG 95 10/13/2021   Last hemoglobin A1c Lab Results  Component Value Date   HGBA1C 5.7 10/13/2021   Last thyroid functions Lab Results  Component Value Date   TSH 0.72 10/13/2021   Last vitamin D No results found for: "25OHVITD2", "25OHVITD3", "VD25OH"   Objective    BP 123/85   Pulse 82   Wt 186 lb 6.4 oz (84.6 kg)   LMP 03/31/2022   SpO2 98%   BMI 34.65 kg/m  BP Readings from Last 3 Encounters:  05/25/22 123/85  11/23/21 129/75  08/15/19 128/82   Wt Readings from Last 3 Encounters:  05/25/22 186 lb 6.4 oz (84.6 kg)  11/23/21 190 lb (86.2 kg)  08/13/19 174 lb (78.9 kg)    Physical Exam  General:  Well Developed, well nourished, appropriate for stated age.  Neuro:  Alert and oriented,  extra-ocular muscles intact  HEENT:  Normocephalic, atraumatic, neck supple  Skin:  no gross rash, warm, pink. Cardiac:  RRR, S1 S2 Respiratory: CTA B/L  Vascular:  Ext warm, no cyanosis apprec.; cap RF less 2 sec. Psych:  No HI/SI, judgement and insight good, Euthymic mood. Full Affect.   No results found for any visits on 05/25/22.  Assessment & Plan      Problem List Items Addressed This Visit       Cardiovascular and Mediastinum   Primary hypertension - Primary    -Stable. Continue current medication regimen. Will collect CMP for medication monitoring.       Relevant Orders   CBC w/Diff   Comp Met (CMET)     Other   Anxiety    -Stable. Continue current medication regimen. Recommend obtaining controlled substance contract for alprazolam 0.5 mg BID as needed for severe anxiety Q:10 tablets.       Other Visit Diagnoses     Hyperhidrosis       Relevant Orders   CBC w/Diff   Comp Met (CMET)   TSH   Ambulatory referral to Endocrinology   Empty sella [facility])       Relevant Orders   Ambulatory referral to Endocrinology      Hyperhidrosis: -Reviewed brain MRI from 2015 which revealed partial empty sella. Patient reports  worsening sweating so will collect labs for further evaluation and place referral to endocrinology.   Return for CPE and FBW affter 10/12/22.        Lorrene Reid, PA-C  [facility] [phone] (phone) [phone] (fax)  [facility]

## 2022-05-26 LAB — COMPREHENSIVE METABOLIC PANEL
ALT: 20 IU/L (ref 0–32)
AST: 18 IU/L (ref 0–40)
Albumin/Globulin Ratio: 1.6 (ref 1.2–2.2)
Albumin: 4.2 g/dL (ref 3.9–4.9)
Alkaline Phosphatase: 73 IU/L (ref 44–121)
BUN/Creatinine Ratio: 13 (ref 9–23)
BUN: 11 mg/dL (ref 6–20)
Bilirubin Total: 0.3 mg/dL (ref 0.0–1.2)
CO2: 19 mmol/L — ABNORMAL LOW (ref 20–29)
Calcium: 8.6 mg/dL — ABNORMAL LOW (ref 8.7–10.2)
Chloride: 106 mmol/L (ref 96–106)
Creatinine, Ser: 0.86 mg/dL (ref 0.57–1.00)
Globulin, Total: 2.7 g/dL (ref 1.5–4.5)
Glucose: 85 mg/dL (ref 70–99)
Potassium: 4.2 mmol/L (ref 3.5–5.2)
Sodium: 140 mmol/L (ref 134–144)
Total Protein: 6.9 g/dL (ref 6.0–8.5)
eGFR: 88 mL/min/{1.73_m2} (ref >59)

## 2022-05-26 LAB — CBC WITH DIFFERENTIAL/PLATELET
Basophils Absolute: 0.1 10*3/uL (ref 0.0–0.2)
Basos: 1 %
EOS (ABSOLUTE): 0.1 10*3/uL (ref 0.0–0.4)
Eos: 1 %
Hematocrit: 41.2 % (ref 34.0–46.6)
Hemoglobin: 13.4 g/dL (ref 11.1–15.9)
Immature Grans (Abs): 0 10*3/uL (ref 0.0–0.1)
Immature Granulocytes: 0 %
Lymphocytes Absolute: 1.5 10*3/uL (ref 0.7–3.1)
Lymphs: 15 %
MCH: 29.3 pg (ref 26.6–33.0)
MCHC: 32.5 g/dL (ref 31.5–35.7)
MCV: 90 fL (ref 79–97)
Monocytes Absolute: 0.5 10*3/uL (ref 0.1–0.9)
Monocytes: 5 %
Neutrophils Absolute: 7.9 10*3/uL — ABNORMAL HIGH (ref 1.4–7.0)
Neutrophils: 78 %
Platelets: 359 10*3/uL (ref 150–450)
RBC: 4.57 x10E6/uL (ref 3.77–5.28)
RDW: 13.1 % (ref 11.7–15.4)
WBC: 10.2 10*3/uL (ref 3.4–10.8)

## 2022-05-26 LAB — TSH: TSH: 1.01 u[IU]/mL (ref 0.450–4.500)

## 2022-06-21 ENCOUNTER — Other Ambulatory Visit (HOSPITAL_COMMUNITY): Payer: Self-pay

## 2022-06-22 ENCOUNTER — Other Ambulatory Visit (HOSPITAL_COMMUNITY): Payer: Self-pay

## 2022-07-13 ENCOUNTER — Other Ambulatory Visit (HOSPITAL_COMMUNITY): Payer: Self-pay

## 2022-07-13 ENCOUNTER — Other Ambulatory Visit: Payer: Self-pay | Admitting: Physical Medicine and Rehabilitation

## 2022-07-13 MED ORDER — AZITHROMYCIN 250 MG PO TABS
ORAL_TABLET | ORAL | 0 refills | Status: AC
  Filled 2022-07-13: qty 6, 5d supply, fill #0

## 2022-08-19 ENCOUNTER — Other Ambulatory Visit (HOSPITAL_COMMUNITY): Payer: Self-pay

## 2022-08-19 ENCOUNTER — Other Ambulatory Visit: Payer: Self-pay | Admitting: Physical Medicine and Rehabilitation

## 2022-08-19 MED ORDER — TIZANIDINE HCL 4 MG PO TABS
4.0000 mg | ORAL_TABLET | Freq: Four times a day (QID) | ORAL | 0 refills | Status: DC | PRN
  Filled 2022-08-19: qty 30, 8d supply, fill #0

## 2022-09-13 ENCOUNTER — Other Ambulatory Visit (HOSPITAL_COMMUNITY): Payer: Self-pay

## 2022-09-13 ENCOUNTER — Other Ambulatory Visit: Payer: Self-pay

## 2022-09-13 NOTE — Progress Notes (Unsigned)
Name: Andrea Love  MRN/ DOB: [id number], 11/01/1980    Age/ Sex: 42 y.o., female    PCP: Lorrene Reid, PA-C   Reason for Endocrinology Evaluation: Partial empty Sella     Date of Initial Endocrinology Evaluation: 09/14/2022     HPI: Andrea Love is a 42 y.o. female with a past medical history of HTN and migraine headaches . The patient presented for initial endocrinology clinic visit on 09/14/2022 for consultative assistance with her Partial empty sella .   She was noted with partial empty sella on MRI imaging of the brain in 2015   No hx of mengitis  No head injury  Has chronic headaches  Denies vision changes  Has excessive sweating Inability to lose weight  Has fatigue all the  time  Has noted increase ring and feet size  Has alternating constipation or diarrhea  She does have easy bruisability  Has gestational  DM  ~13 yrs ago  Endorses hypoglycemia in the 40's per pt  Denies proximal muscle weakness but has arthralgia   She is on Coc's  No steroids for at least 3 months     HISTORY:  Past Medical History:  Past Medical History:  Diagnosis Date   Diabetes mellitus without complication ([place])    Hypertension    Migraines    Past Surgical History:  Past Surgical History:  Procedure Laterality Date   CESAREAN SECTION  10/13/2009   MOUTH SURGERY  12/2006   wisdom teeth    Social History:  reports that she has never smoked. She has never used smokeless tobacco. She reports current alcohol use of about 0.5 standard drinks of alcohol per week. She reports that she does not use drugs. Family History: family history includes Bone cancer in her maternal grandmother; Breast cancer in her maternal grandmother; Cancer in her paternal grandmother; Diabetes in her mother and paternal grandfather; Heart attack in her paternal grandfather; Hyperlipidemia in her father; Hypertension in her father and mother; Other in her father; Prostate cancer in her paternal  grandfather; Seizures in her maternal grandfather; Stroke in her maternal grandfather.   HOME MEDICATIONS: Allergies as of 09/14/2022       Reactions   Macrobid [nitrofurantoin Monohyd Macro]    Sulfa Antibiotics    Enalapril Cough        Medication List        Accurate as of September 14, 2022  8:19 AM. If you have any questions, ask your nurse or doctor.          ALPRAZolam 0.5 MG tablet Commonly known as: XANAX Take 1 tablet  by mouth 2  times daily as needed for anxiety. For MRI scan   buPROPion 150 MG 24 hr tablet Commonly known as: Wellbutrin XL Take 1 tablet by mouth every day   cyclobenzaprine 5 MG tablet Commonly known as: FLEXERIL Take 2 tablets by mouth 3 times daily as needed for muscle spasms.   fluticasone 50 MCG/ACT nasal spray Commonly known as: FLONASE PLACE 2 SPRAYS INTO BOTH NOSTRILS DAILY.   methylPREDNISolone 4 MG Tbpk tablet Commonly known as: MEDROL DOSEPAK Take as directed on package   metroNIDAZOLE 500 MG tablet Commonly known as: FLAGYL Take 1 tablet (500 mg total) by mouth 2 (two) times daily.   rizatriptan 5 MG tablet Commonly known as: MAXALT Take 1 tablet  by mouth as needed for migraine. May repeat in 2 hours if needed   Setlakin 0.15-0.03 MG tablet Generic drug:  levonorgestrel-ethinyl estradiol Take 1 tablet by mouth daily What changed: Another medication with the same name was removed. Continue taking this medication, and follow the directions you see here. Changed by: Dorita Sciara, MD   telmisartan 20 MG tablet Commonly known as: MICARDIS Take 1 tablet by mouth every day   tiZANidine 4 MG tablet Commonly known as: Zanaflex Take 1 tablet (4 mg total) by mouth every 6 (six) hours as needed for muscle spasms.   Vitamin D 50 MCG (2000 UT) Caps Take by mouth.          REVIEW OF SYSTEMS: A comprehensive ROS was conducted with the patient and is negative except as per HPI and below:  ROS     OBJECTIVE:   VS: BP 124/88 (BP Location: Left Arm, Patient Position: Sitting, Cuff Size: Normal)   Pulse 90   Ht 5' 1.5" (1.562 m)   Wt 196 lb 6.4 oz (89.1 kg)   SpO2 99%   BMI 36.51 kg/m    Wt Readings from Last 3 Encounters:  09/14/22 196 lb 6.4 oz (89.1 kg)  05/25/22 186 lb 6.4 oz (84.6 kg)  11/23/21 190 lb (86.2 kg)     EXAM: General: Pt appears well and is in NAD  Eyes: External eye exam normal without stare, lid lag or exophthalmos.  EOM intact.  Neck: General: Supple without adenopathy. Thyroid: Thyroid size normal.  No goiter or nodules appreciated.   Lungs: Clear with good BS bilat with no rales, rhonchi, or wheezes  Heart: Auscultation: RRR.  Abdomen: Normoactive bowel sounds, soft, nontender, without masses or organomegaly palpable  Extremities:  BL LE: No pretibial edema normal ROM and strength.  Mental Status: Judgment, insight: Intact Orientation: Oriented to time, place, and person Mood and affect: No depression, anxiety, or agitation     DATA REVIEWED: ***    ASSESSMENT/PLAN/RECOMMENDATIONS:   Partial Empty Sella :  - No clinical evidence of hypo or hyperpituitarism   Medications :  Signed electronically by: Mack Guise, MD  [facility]  [address] [place], [place] [address] Phone: [phone] FAX: [phone]   CC: Lorrene Reid, PA-C No address on file Phone: None Fax: None   Return to Endocrinology clinic as below: Future Appointments  Date Time Provider [place]  10/13/2022  8:10 AM Ronnell Freshwater, NP [facility] None

## 2022-09-14 ENCOUNTER — Ambulatory Visit: Payer: 59 | Admitting: Internal Medicine

## 2022-09-14 ENCOUNTER — Encounter: Payer: Self-pay | Admitting: Internal Medicine

## 2022-09-14 VITALS — BP 124/88 | HR 90 | Ht 61.5 in | Wt 196.4 lb

## 2022-09-14 DIAGNOSIS — E236 Other disorders of pituitary gland: Secondary | ICD-10-CM

## 2022-09-14 LAB — BASIC METABOLIC PANEL
BUN: 13 mg/dL (ref 6–23)
CO2: 22 mEq/L (ref 19–32)
Calcium: 8.8 mg/dL (ref 8.4–10.5)
Chloride: 106 mEq/L (ref 96–112)
Creatinine, Ser: 0.8 mg/dL (ref 0.40–1.20)
GFR: 92.37 mL/min (ref >60.00)
Glucose, Bld: 84 mg/dL (ref 70–99)
Potassium: 4 mEq/L (ref 3.5–5.1)
Sodium: 138 mEq/L (ref 135–145)

## 2022-09-14 LAB — T4, FREE: Free T4: 0.77 ng/dL (ref 0.60–1.60)

## 2022-09-14 LAB — FOLLICLE STIMULATING HORMONE: FSH: 0.8 m[IU]/mL

## 2022-09-14 LAB — TSH: TSH: 1.17 u[IU]/mL (ref 0.35–5.50)

## 2022-09-14 LAB — CORTISOL: Cortisol, Plasma: 12.9 ug/dL

## 2022-09-14 LAB — LUTEINIZING HORMONE: LH: 0.01 m[IU]/mL

## 2022-09-19 ENCOUNTER — Encounter: Payer: Self-pay | Admitting: Nurse Practitioner

## 2022-09-19 LAB — ACTH: C206 ACTH: 13 pg/mL (ref 6–50)

## 2022-09-19 LAB — PROLACTIN: Prolactin: 7.9 ng/mL

## 2022-09-19 LAB — INSULIN-LIKE GROWTH FACTOR
IGF-I, LC/MS: 119 ng/mL (ref 52–328)
Z-Score (Female): -0.4 SD (ref ?–2.0)

## 2022-09-19 LAB — ESTRADIOL: Estradiol: <15 pg/mL

## 2022-10-01 ENCOUNTER — Other Ambulatory Visit (HOSPITAL_COMMUNITY): Payer: Self-pay

## 2022-10-01 ENCOUNTER — Other Ambulatory Visit: Payer: Self-pay | Admitting: Physical Medicine and Rehabilitation

## 2022-10-01 MED ORDER — AZITHROMYCIN 250 MG PO TABS
ORAL_TABLET | ORAL | 0 refills | Status: AC
  Filled 2022-10-01: qty 6, 5d supply, fill #0

## 2022-10-13 ENCOUNTER — Ambulatory Visit (INDEPENDENT_AMBULATORY_CARE_PROVIDER_SITE_OTHER): Payer: 59 | Admitting: Nurse Practitioner

## 2022-10-13 ENCOUNTER — Other Ambulatory Visit (HOSPITAL_COMMUNITY): Payer: Self-pay

## 2022-10-13 ENCOUNTER — Encounter: Payer: Self-pay | Admitting: Nurse Practitioner

## 2022-10-13 VITALS — BP 138/82 | HR 88 | Ht 61.5 in | Wt 197.0 lb

## 2022-10-13 DIAGNOSIS — E349 Endocrine disorder, unspecified: Secondary | ICD-10-CM

## 2022-10-13 DIAGNOSIS — F419 Anxiety disorder, unspecified: Secondary | ICD-10-CM

## 2022-10-13 DIAGNOSIS — I10 Essential (primary) hypertension: Secondary | ICD-10-CM | POA: Diagnosis not present

## 2022-10-13 DIAGNOSIS — E559 Vitamin D deficiency, unspecified: Secondary | ICD-10-CM

## 2022-10-13 DIAGNOSIS — E237 Disorder of pituitary gland, unspecified: Secondary | ICD-10-CM | POA: Diagnosis not present

## 2022-10-13 DIAGNOSIS — Z0001 Encounter for general adult medical examination with abnormal findings: Secondary | ICD-10-CM

## 2022-10-13 MED ORDER — TELMISARTAN 20 MG PO TABS
20.0000 mg | ORAL_TABLET | Freq: Every day | ORAL | 3 refills | Status: DC
  Filled 2022-10-13 – 2022-11-08 (×2): qty 90, 90d supply, fill #0
  Filled 2023-02-03: qty 90, 90d supply, fill #1

## 2022-10-13 MED ORDER — BUPROPION HCL ER (XL) 150 MG PO TB24
150.0000 mg | ORAL_TABLET | Freq: Every day | ORAL | 3 refills | Status: DC
  Filled 2022-10-13: qty 90, fill #0
  Filled 2022-11-08 – 2022-12-14 (×2): qty 90, 90d supply, fill #0
  Filled 2023-03-18 (×2): qty 90, 90d supply, fill #1

## 2022-10-13 NOTE — Progress Notes (Signed)
Complete physical exam   Patient: Andrea Love   DOB: 13-Sep-1980   42 y.o. Female  MRN: [id number] Visit Date: 10/13/2022    Chief Complaint  Patient presents with   Annual Exam   Subjective    Andrea Love is a 42 y.o. female who presents today for a complete physical exam.  She reports consuming a  generally healthy   diet.  She generally feels fairly well. She does have additional problems to discuss today.   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 -reviewing labs, she appears to be post menopausal, however, has regular monthly cycles.  --without birth control, menstrual periods would be very heavy.  -FSH and LH also on low side, consistent with post-menopausal condition.    Past Medical History:  Diagnosis Date   Diabetes mellitus without complication ([place])    Hypertension    Migraines    Past Surgical History:  Procedure Laterality Date   CESAREAN SECTION  10/13/2009   MOUTH SURGERY  12/2006   wisdom teeth   Social History   Socioeconomic History   Marital status: Married    Spouse name: Not on file   Number of children: 1   Years of education: Not on file   Highest education level: Not on file  Occupational History   Not on file  Tobacco Use   Smoking status: Never   Smokeless tobacco: Never  Substance and Sexual Activity   Alcohol use: Yes    Alcohol/week: 0.5 standard drinks of alcohol    Types: 1 Standard drinks or equivalent per week   Drug use: No   Sexual activity: Not on file  Other Topics Concern   Not on file  Social History Narrative   Not on file   Social Determinants of Health   Financial Resource Strain: Not on file  Food Insecurity: Not on file  Transportation Needs: Not on file  Physical Activity: Not on file  Stress: Not on file  Social Connections: Not on file  Intimate Partner Violence: Not on file   Family Status  Relation Name Status   Mother  Alive   Father  Alive    MGM  (Not Specified)   MGF  (Not Specified)   PGM  (Not Specified)   PGF  (Not Specified)   Family History  Problem Relation Age of Onset   Hypertension Mother    Diabetes Mother    Hypertension Father    Hyperlipidemia Father    Other Father        Cerebral Ataxia   Breast cancer Maternal Grandmother    Bone cancer Maternal Grandmother    Stroke Maternal Grandfather    Seizures Maternal Grandfather    Cancer Paternal Grandmother        Brain Tumor   Diabetes Paternal Grandfather    Prostate cancer Paternal Grandfather    Heart attack Paternal Grandfather    Allergies  Allergen Reactions   Macrobid [Nitrofurantoin Monohyd Macro]    Sulfa Antibiotics    Enalapril Cough    Patient Care Team: Velva Harman, PA as PCP - General (Family Medicine)   Medications: Outpatient Medications Prior to Visit  Medication Sig   ALPRAZolam (XANAX) 0.5 MG tablet Take 1 tablet  by mouth 2  times daily as needed for anxiety. For MRI scan   Cholecalciferol (VITAMIN D) 50 MCG (2000 UT) CAPS Take by mouth.   cyclobenzaprine (FLEXERIL) 5 MG tablet Take 2  tablets by mouth 3 times daily as needed for muscle spasms.   levonorgestrel-ethinyl estradiol (SETLAKIN) 0.15-0.03 MG tablet Take 1 tablet by mouth daily   metroNIDAZOLE (FLAGYL) 500 MG tablet Take 1 tablet (500 mg total) by mouth 2 (two) times daily.   rizatriptan (MAXALT) 5 MG tablet Take 1 tablet  by mouth as needed for migraine. May repeat in 2 hours if needed   tiZANidine (ZANAFLEX) 4 MG tablet Take 1 tablet (4 mg total) by mouth every 6 (six) hours as needed for muscle spasms.   [DISCONTINUED] buPROPion (WELLBUTRIN XL) 150 MG 24 hr tablet Take 1 tablet by mouth every day   [DISCONTINUED] telmisartan (MICARDIS) 20 MG tablet Take 1 tablet by mouth every day   [DISCONTINUED] fluticasone (FLONASE) 50 MCG/ACT nasal spray PLACE 2 SPRAYS INTO BOTH NOSTRILS DAILY.   [DISCONTINUED] methylPREDNISolone (MEDROL DOSEPAK) 4 MG TBPK tablet Take as  directed on package (Patient not taking: Reported on 09/14/2022)   No facility-administered medications prior to visit.    Review of Systems See HPI     Last CBC Lab Results  Component Value Date   WBC 10.4 10/13/2022   HGB 13.3 10/13/2022   HCT 40.1 10/13/2022   MCV 88 10/13/2022   MCH 29.2 10/13/2022   RDW 13.1 10/13/2022   PLT 423 [id number]   Last metabolic panel Lab Results  Component Value Date   GLUCOSE 88 10/13/2022   NA 140 10/13/2022   K 4.3 10/13/2022   CL 103 10/13/2022   CO2 21 10/13/2022   BUN 10 10/13/2022   CREATININE 0.86 10/13/2022   EGFR 88 10/13/2022   CALCIUM 8.9 10/13/2022   PROT 7.2 10/13/2022   ALBUMIN 4.1 10/13/2022   LABGLOB 3.1 10/13/2022   AGRATIO 1.3 10/13/2022   BILITOT 0.2 10/13/2022   ALKPHOS 82 10/13/2022   AST 19 10/13/2022   ALT 19 10/13/2022   ANIONGAP 10 08/13/2019   Last lipids Lab Results  Component Value Date   CHOL 164 10/13/2022   HDL 39 (L) 10/13/2022   LDLCALC 103 (H) 10/13/2022   TRIG 121 10/13/2022   CHOLHDL 4.2 10/13/2022   Last hemoglobin A1c Lab Results  Component Value Date   HGBA1C 5.8 (H) 10/13/2022   Last thyroid functions Lab Results  Component Value Date   TSH 1.260 10/13/2022        Objective     Today's Vitals   10/13/22 0810  BP: 138/82  Pulse: 88  SpO2: 98%  Weight: 197 lb (89.4 kg)  Height: 5' 1.5" (1.562 m)   Body mass index is 36.62 kg/m.  BP Readings from Last 3 Encounters:  10/13/22 138/82  09/14/22 124/88  05/25/22 123/85    Wt Readings from Last 3 Encounters:  10/13/22 197 lb (89.4 kg)  09/14/22 196 lb 6.4 oz (89.1 kg)  05/25/22 186 lb 6.4 oz (84.6 kg)     Physical Exam Vitals and nursing note reviewed.  Constitutional:      Appearance: Normal appearance. She is well-developed.  HENT:     Head: Normocephalic and atraumatic.     Nose: Nose normal.     Mouth/Throat:     Mouth: Mucous membranes are moist.     Pharynx: Oropharynx is clear.  Eyes:      Extraocular Movements: Extraocular movements intact.     Conjunctiva/sclera: Conjunctivae normal.     Pupils: Pupils are equal, round, and reactive to light.  Neck:     Vascular: No carotid bruit.  Cardiovascular:  Rate and Rhythm: Normal rate and regular rhythm.     Pulses: Normal pulses.     Heart sounds: Normal heart sounds.  Pulmonary:     Effort: Pulmonary effort is normal.     Breath sounds: Normal breath sounds.  Abdominal:     Palpations: Abdomen is soft.  Musculoskeletal:        General: Normal range of motion.     Cervical back: Normal range of motion and neck supple.  Lymphadenopathy:     Cervical: No cervical adenopathy.  Skin:    General: Skin is warm and dry.     Capillary Refill: Capillary refill takes less than 2 seconds.  Neurological:     General: No focal deficit present.     Mental Status: She is alert and oriented to person, place, and time.  Psychiatric:        Mood and Affect: Mood normal.        Behavior: Behavior normal.        Thought Content: Thought content normal.        Judgment: Judgment normal.     Last depression screening scores   Row Labels 10/13/2022    8:17 AM 05/25/2022    8:17 AM 11/23/2021    9:21 AM  PHQ 2/9 Scores   Section Header. No data exists in this row.     PHQ - 2 Score   1 0 0  PHQ- 9 Score   5 2 3    Last fall risk screening   Row Labels 10/13/2022    8:18 AM  Fall Risk    Section Header. No data exists in this row.   Falls in the past year?   0  Number falls in past yr:   0  Injury with Fall?   0  Follow up   Falls evaluation completed     Results for orders placed or performed in visit on 10/13/22  CBC  Result Value Ref Range   WBC 10.4 3.4 - 10.8 x10E3/uL   RBC 4.55 3.77 - 5.28 x10E6/uL   Hemoglobin 13.3 11.1 - 15.9 g/dL   Hematocrit 40.1 34.0 - 46.6 %   MCV 88 79 - 97 fL   MCH 29.2 26.6 - 33.0 pg   MCHC 33.2 31.5 - 35.7 g/dL   RDW 13.1 11.7 - 15.4 %   Platelets 423 150 - 450 x10E3/uL  Comprehensive  metabolic panel  Result Value Ref Range   Glucose 88 70 - 99 mg/dL   BUN 10 6 - 24 mg/dL   Creatinine, Ser 0.86 0.57 - 1.00 mg/dL   eGFR 88 >59 mL/min/1.73   BUN/Creatinine Ratio 12 9 - 23   Sodium 140 134 - 144 mmol/L   Potassium 4.3 3.5 - 5.2 mmol/L   Chloride 103 96 - 106 mmol/L   CO2 21 20 - 29 mmol/L   Calcium 8.9 8.7 - 10.2 mg/dL   Total Protein 7.2 6.0 - 8.5 g/dL   Albumin 4.1 3.9 - 4.9 g/dL   Globulin, Total 3.1 1.5 - 4.5 g/dL   Albumin/Globulin Ratio 1.3 1.2 - 2.2   Bilirubin Total 0.2 0.0 - 1.2 mg/dL   Alkaline Phosphatase 82 44 - 121 IU/L   AST 19 0 - 40 IU/L   ALT 19 0 - 32 IU/L  Lipid panel  Result Value Ref Range   Cholesterol, Total 164 100 - 199 mg/dL   Triglycerides 121 0 - 149 mg/dL   HDL 39 (L) >39 mg/dL  VLDL Cholesterol Cal 22 5 - 40 mg/dL   LDL Chol Calc (NIH) 103 (H) 0 - 99 mg/dL   Chol/HDL Ratio 4.2 0.0 - 4.4 ratio  Hemoglobin A1c  Result Value Ref Range   Hgb A1c MFr Bld 5.8 (H) 4.8 - 5.6 %   Est. average glucose Bld gHb Est-mCnc 120 mg/dL  VITAMIN D 25 Hydroxy (Vit-D Deficiency, Fractures)  Result Value Ref Range   Vit D, 25-Hydroxy 64.9 30.0 - 100.0 ng/mL  TSH + free T4  Result Value Ref Range   TSH 1.260 0.450 - 4.500 uIU/mL   Free T4 1.06 0.82 - 1.77 ng/dL    Assessment & Plan    Encounter for general adult medical examination with abnormal findings  Primary hypertension Assessment & Plan: Stable. Continue current medication.   Orders: -     Telmisartan; Take 1 tablet (20 mg total) by mouth daily.  Dispense: 90 tablet; Refill: 3  Vitamin D deficiency Assessment & Plan: Check vitamin d level and treat deficiency as indicated.    Orders: -     VITAMIN D 25 Hydroxy (Vit-D Deficiency, Fractures); Future  Anxiety Assessment & Plan: Stable. Continue current medication.   Orders: -     buPROPion HCl ER (XL); Take 1 tablet (150 mg total) by mouth daily.  Dispense: 90 tablet; Refill: 3  Pituitary abnormality [facility]) Assessment &  Plan: Reviewed reproductive hormone panel which indicates post-menopausal. Patient having menstrual  periods. History of pituitary abnormality. Refer to endocrinology for further evaluation.   Orders: -     Ambulatory referral to Endocrinology -     TSH + free T4; Future  Hormone imbalance Assessment & Plan: Reviewed reproductive hormone panel which indicates post-menopausal. Patient having menstrual  periods. History of pituitary abnormality. Refer to endocrinology for further evaluation.   Orders: -     Ambulatory referral to Endocrinology -     TSH + free T4; Future -     Hemoglobin A1c; Future -     Lipid panel; Future -     Comprehensive metabolic panel; Future -     CBC; Future      Immunization History  Administered Date(s) Administered   Influenza, Seasonal, Injecte, Preservative Fre 05/26/2016, 05/16/2017, 05/17/2018, 05/30/2019   Influenza-Unspecified 05/16/2017, 05/30/2019    Health Maintenance  Topic Date Due   COVID-19 Vaccine (1) Never done   HIV Screening  Never done   Hepatitis C Screening  Never done   DTaP/Tdap/Td (1 - Tdap) Never done   INFLUENZA VACCINE  03/23/2022   PAP SMEAR-Modifier  10/20/2024   HPV VACCINES  Aged Out    Discussed health benefits of physical activity, and encouraged her to engage in regular exercise appropriate for her age and condition.  Problem List Items Addressed This Visit       Cardiovascular and Mediastinum   Primary hypertension    Stable. Continue current medication.       Relevant Medications   telmisartan (MICARDIS) 20 MG tablet     Endocrine   Hormone imbalance    Reviewed reproductive hormone panel which indicates post-menopausal. Patient having menstrual  periods. History of pituitary abnormality. Refer to endocrinology for further evaluation.       Relevant Orders   Ambulatory referral to Endocrinology   TSH + free T4 (Completed)   Hemoglobin A1c (Completed)   Lipid panel (Completed)   Comprehensive  metabolic panel (Completed)   CBC (Completed)     Other   Anxiety  Stable. Continue current medication.       Relevant Medications   buPROPion (WELLBUTRIN XL) 150 MG 24 hr tablet   Vitamin D deficiency    Check vitamin d level and treat deficiency as indicated.        Relevant Orders   VITAMIN D 25 Hydroxy (Vit-D Deficiency, Fractures) (Completed)   Other Visit Diagnoses     Encounter for general adult medical examination with abnormal findings    -  Primary        Return in about 6 months (around 04/13/2023) for blood pressure, mood.        Ronnell Freshwater, NP  [facility] [phone] (phone) [phone] (fax)  [place]

## 2022-10-14 LAB — CBC
Hematocrit: 40.1 % (ref 34.0–46.6)
Hemoglobin: 13.3 g/dL (ref 11.1–15.9)
MCH: 29.2 pg (ref 26.6–33.0)
MCHC: 33.2 g/dL (ref 31.5–35.7)
MCV: 88 fL (ref 79–97)
Platelets: 423 10*3/uL (ref 150–450)
RBC: 4.55 x10E6/uL (ref 3.77–5.28)
RDW: 13.1 % (ref 11.7–15.4)
WBC: 10.4 10*3/uL (ref 3.4–10.8)

## 2022-10-14 LAB — COMPREHENSIVE METABOLIC PANEL
ALT: 19 IU/L (ref 0–32)
AST: 19 IU/L (ref 0–40)
Albumin/Globulin Ratio: 1.3 (ref 1.2–2.2)
Albumin: 4.1 g/dL (ref 3.9–4.9)
Alkaline Phosphatase: 82 IU/L (ref 44–121)
BUN/Creatinine Ratio: 12 (ref 9–23)
BUN: 10 mg/dL (ref 6–24)
Bilirubin Total: 0.2 mg/dL (ref 0.0–1.2)
CO2: 21 mmol/L (ref 20–29)
Calcium: 8.9 mg/dL (ref 8.7–10.2)
Chloride: 103 mmol/L (ref 96–106)
Creatinine, Ser: 0.86 mg/dL (ref 0.57–1.00)
Globulin, Total: 3.1 g/dL (ref 1.5–4.5)
Glucose: 88 mg/dL (ref 70–99)
Potassium: 4.3 mmol/L (ref 3.5–5.2)
Sodium: 140 mmol/L (ref 134–144)
Total Protein: 7.2 g/dL (ref 6.0–8.5)
eGFR: 88 mL/min/{1.73_m2} (ref >59)

## 2022-10-14 LAB — LIPID PANEL
Chol/HDL Ratio: 4.2 ratio (ref 0.0–4.4)
Cholesterol, Total: 164 mg/dL (ref 100–199)
HDL: 39 mg/dL — ABNORMAL LOW (ref >39)
LDL Chol Calc (NIH): 103 mg/dL — ABNORMAL HIGH (ref 0–99)
Triglycerides: 121 mg/dL (ref 0–149)
VLDL Cholesterol Cal: 22 mg/dL (ref 5–40)

## 2022-10-14 LAB — TSH+FREE T4
Free T4: 1.06 ng/dL (ref 0.82–1.77)
TSH: 1.26 u[IU]/mL (ref 0.450–4.500)

## 2022-10-14 LAB — HEMOGLOBIN A1C
Est. average glucose Bld gHb Est-mCnc: 120 mg/dL
Hgb A1c MFr Bld: 5.8 % — ABNORMAL HIGH (ref 4.8–5.6)

## 2022-10-14 LAB — VITAMIN D 25 HYDROXY (VIT D DEFICIENCY, FRACTURES): Vit D, 25-Hydroxy: 64.9 ng/mL (ref 30.0–100.0)

## 2022-10-21 ENCOUNTER — Other Ambulatory Visit: Payer: Self-pay | Admitting: Physical Medicine and Rehabilitation

## 2022-10-21 ENCOUNTER — Ambulatory Visit: Payer: Self-pay

## 2022-10-21 ENCOUNTER — Other Ambulatory Visit (HOSPITAL_COMMUNITY): Payer: Self-pay

## 2022-10-21 DIAGNOSIS — M545 Low back pain, unspecified: Secondary | ICD-10-CM

## 2022-10-25 ENCOUNTER — Encounter: Payer: Self-pay | Admitting: Nurse Practitioner

## 2022-10-25 ENCOUNTER — Other Ambulatory Visit: Payer: Self-pay | Admitting: Nurse Practitioner

## 2022-10-25 DIAGNOSIS — Z1231 Encounter for screening mammogram for malignant neoplasm of breast: Secondary | ICD-10-CM

## 2022-10-25 DIAGNOSIS — E236 Other disorders of pituitary gland: Secondary | ICD-10-CM

## 2022-10-25 DIAGNOSIS — R61 Generalized hyperhidrosis: Secondary | ICD-10-CM

## 2022-10-27 ENCOUNTER — Ambulatory Visit
Admission: RE | Admit: 2022-10-27 | Discharge: 2022-10-27 | Disposition: A | Discharge status: Home / Self Care | Payer: 59 | Source: Ambulatory Visit

## 2022-10-27 DIAGNOSIS — Z1231 Encounter for screening mammogram for malignant neoplasm of breast: Secondary | ICD-10-CM | POA: Diagnosis not present

## 2022-10-29 ENCOUNTER — Telehealth: Payer: Self-pay | Admitting: *Deleted

## 2022-10-29 NOTE — Telephone Encounter (Signed)
Opened in error

## 2022-11-02 NOTE — Progress Notes (Signed)
Negative mammogram. Repeat in one year.

## 2022-11-08 ENCOUNTER — Other Ambulatory Visit: Payer: Self-pay

## 2022-11-08 ENCOUNTER — Other Ambulatory Visit (HOSPITAL_COMMUNITY): Payer: Self-pay

## 2022-11-12 ENCOUNTER — Ambulatory Visit: Payer: 59 | Admitting: Internal Medicine

## 2022-11-13 DIAGNOSIS — E349 Endocrine disorder, unspecified: Secondary | ICD-10-CM | POA: Insufficient documentation

## 2022-11-13 DIAGNOSIS — E559 Vitamin D deficiency, unspecified: Secondary | ICD-10-CM | POA: Insufficient documentation

## 2022-11-13 NOTE — Assessment & Plan Note (Signed)
Stable.  Continue current medication

## 2022-11-13 NOTE — Assessment & Plan Note (Signed)
Check vitamin d level and treat deficiency as indicated.

## 2022-11-13 NOTE — Assessment & Plan Note (Signed)
Reviewed reproductive hormone panel which indicates post-menopausal. Patient having menstrual  periods. History of pituitary abnormality. Refer to endocrinology for further evaluation.

## 2022-11-29 ENCOUNTER — Telehealth: Payer: Self-pay | Admitting: Orthopedic Surgery

## 2022-11-29 ENCOUNTER — Other Ambulatory Visit (HOSPITAL_COMMUNITY): Payer: Self-pay

## 2022-11-29 DIAGNOSIS — R61 Generalized hyperhidrosis: Secondary | ICD-10-CM | POA: Diagnosis not present

## 2022-11-29 DIAGNOSIS — I10 Essential (primary) hypertension: Secondary | ICD-10-CM | POA: Diagnosis not present

## 2022-11-29 DIAGNOSIS — R7303 Prediabetes: Secondary | ICD-10-CM | POA: Diagnosis not present

## 2022-11-29 DIAGNOSIS — E236 Other disorders of pituitary gland: Secondary | ICD-10-CM | POA: Diagnosis not present

## 2022-11-29 MED ORDER — METHYLPREDNISOLONE 4 MG PO TBPK
ORAL_TABLET | ORAL | 0 refills | Status: DC
  Filled 2022-11-29: qty 21, 6d supply, fill #0

## 2022-11-29 NOTE — Telephone Encounter (Signed)
Orthopedic Note  Patient was mowing the lawn this weekend and inhaled a lot of pollen. Has had upper respiratory symptoms since that time. On exam, she has a cough but is able to breath unlabored on room air. Has not had any productive sputum. Has had episodes like this in the past due to allergies. She has been using an over the counter antitussive but has not been getting good relief. Recommend trial of a medrol dose pak. That was prescribed today. I will see her back on an as needed basis.   London Sheer, MD Orthopedic Surgeon

## 2022-11-30 DIAGNOSIS — I10 Essential (primary) hypertension: Secondary | ICD-10-CM | POA: Diagnosis not present

## 2022-11-30 DIAGNOSIS — L680 Hirsutism: Secondary | ICD-10-CM | POA: Diagnosis not present

## 2022-11-30 DIAGNOSIS — R7303 Prediabetes: Secondary | ICD-10-CM | POA: Diagnosis not present

## 2022-11-30 DIAGNOSIS — R635 Abnormal weight gain: Secondary | ICD-10-CM | POA: Diagnosis not present

## 2022-12-02 ENCOUNTER — Other Ambulatory Visit: Payer: Self-pay | Admitting: Physical Medicine and Rehabilitation

## 2022-12-02 ENCOUNTER — Other Ambulatory Visit (HOSPITAL_COMMUNITY): Payer: Self-pay

## 2022-12-02 ENCOUNTER — Other Ambulatory Visit: Payer: Self-pay | Admitting: Sports Medicine

## 2022-12-02 MED ORDER — BENZONATATE 100 MG PO CAPS
100.0000 mg | ORAL_CAPSULE | Freq: Three times a day (TID) | ORAL | 0 refills | Status: AC | PRN
  Filled 2022-12-02: qty 30, 10d supply, fill #0

## 2022-12-02 MED ORDER — AMOXICILLIN-POT CLAVULANATE 875-125 MG PO TABS
1.0000 | ORAL_TABLET | Freq: Two times a day (BID) | ORAL | 0 refills | Status: DC
  Filled 2022-12-02: qty 14, 7d supply, fill #0

## 2022-12-02 NOTE — Progress Notes (Signed)
Acute on chronic cough. Has seen PCP and on prednisone, Augmentin. Will send in Tessalon 100mg  TID PRN.  Madelyn Brunner, DO Primary Care Sports Medicine Physician  [facility] - Orthopedics

## 2022-12-06 ENCOUNTER — Other Ambulatory Visit: Payer: Self-pay | Admitting: Nurse Practitioner

## 2022-12-06 ENCOUNTER — Other Ambulatory Visit: Payer: Self-pay | Admitting: Sports Medicine

## 2022-12-06 ENCOUNTER — Other Ambulatory Visit (INDEPENDENT_AMBULATORY_CARE_PROVIDER_SITE_OTHER): Payer: 59

## 2022-12-06 ENCOUNTER — Other Ambulatory Visit (HOSPITAL_COMMUNITY): Payer: Self-pay

## 2022-12-06 ENCOUNTER — Other Ambulatory Visit: Payer: Self-pay

## 2022-12-06 ENCOUNTER — Encounter: Payer: Self-pay | Admitting: Nurse Practitioner

## 2022-12-06 DIAGNOSIS — R052 Subacute cough: Secondary | ICD-10-CM

## 2022-12-06 DIAGNOSIS — J069 Acute upper respiratory infection, unspecified: Secondary | ICD-10-CM

## 2022-12-06 DIAGNOSIS — J301 Allergic rhinitis due to pollen: Secondary | ICD-10-CM

## 2022-12-06 MED ORDER — ALBUTEROL SULFATE HFA 108 (90 BASE) MCG/ACT IN AERS
2.0000 | INHALATION_SPRAY | Freq: Four times a day (QID) | RESPIRATORY_TRACT | 2 refills | Status: DC | PRN
Start: 2022-12-06 — End: 2024-04-06
  Filled 2022-12-06: qty 6.7, 20d supply, fill #0
  Filled 2023-08-12: qty 6.7, 20d supply, fill #1

## 2022-12-06 MED ORDER — PREDNISONE 10 MG (21) PO TBPK
ORAL_TABLET | ORAL | 0 refills | Status: DC
Start: 2022-12-06 — End: 2022-12-14
  Filled 2022-12-06: qty 21, 6d supply, fill #0

## 2022-12-06 MED ORDER — FLUTICASONE PROPIONATE 50 MCG/ACT NA SUSP
2.0000 | Freq: Every day | NASAL | 6 refills | Status: DC
Start: 2022-12-06 — End: 2023-04-18
  Filled 2022-12-06: qty 16, 30d supply, fill #0
  Filled 2023-02-03: qty 16, 30d supply, fill #1

## 2022-12-10 ENCOUNTER — Other Ambulatory Visit: Payer: Self-pay | Admitting: Physical Medicine and Rehabilitation

## 2022-12-10 ENCOUNTER — Other Ambulatory Visit (HOSPITAL_COMMUNITY): Payer: Self-pay

## 2022-12-10 MED ORDER — MELOXICAM 15 MG PO TABS
15.0000 mg | ORAL_TABLET | Freq: Every day | ORAL | 0 refills | Status: AC
  Filled 2022-12-10: qty 30, 30d supply, fill #0

## 2022-12-13 NOTE — Telephone Encounter (Signed)
Patient called office concerning her having still coughing up a lot of  phlegm, patient would like to know if she needs to be seen or if she could get something called into her pharmacy, please advise,thanks.

## 2022-12-14 ENCOUNTER — Other Ambulatory Visit (HOSPITAL_COMMUNITY): Payer: Self-pay

## 2022-12-14 ENCOUNTER — Ambulatory Visit (INDEPENDENT_AMBULATORY_CARE_PROVIDER_SITE_OTHER): Payer: 59 | Admitting: Family Medicine

## 2022-12-14 ENCOUNTER — Encounter: Payer: Self-pay | Admitting: Family Medicine

## 2022-12-14 ENCOUNTER — Other Ambulatory Visit: Payer: Self-pay

## 2022-12-14 VITALS — BP 162/94 | HR 107 | Resp 18 | Ht 61.5 in | Wt 195.0 lb

## 2022-12-14 DIAGNOSIS — I10 Essential (primary) hypertension: Secondary | ICD-10-CM

## 2022-12-14 DIAGNOSIS — R051 Acute cough: Secondary | ICD-10-CM

## 2022-12-14 DIAGNOSIS — B9689 Other specified bacterial agents as the cause of diseases classified elsewhere: Secondary | ICD-10-CM | POA: Diagnosis not present

## 2022-12-14 DIAGNOSIS — J069 Acute upper respiratory infection, unspecified: Secondary | ICD-10-CM

## 2022-12-14 MED ORDER — AZITHROMYCIN 250 MG PO TABS
ORAL_TABLET | ORAL | 0 refills | Status: DC
Start: 2022-12-14 — End: 2023-08-12

## 2022-12-14 MED ORDER — CEFPODOXIME PROXETIL 200 MG PO TABS
200.0000 mg | ORAL_TABLET | Freq: Two times a day (BID) | ORAL | 0 refills | Status: DC
Start: 2022-12-14 — End: 2023-08-12

## 2022-12-14 MED ORDER — GUAIFENESIN-CODEINE 100-10 MG/5ML PO SOLN: 10.0000 mL | Freq: Every day | ORAL | 0 refills | Status: DC

## 2022-12-14 MED ORDER — GUAIFENESIN ER 600 MG PO TB12
1200.0000 mg | ORAL_TABLET | ORAL | 0 refills | Status: DC
Start: 2022-12-14 — End: 2023-04-18

## 2022-12-14 NOTE — Assessment & Plan Note (Signed)
Blood pressure has been elevated during the course of her symptoms, was previously stable.  Advised her to continue ambulatory blood pressure monitoring and to let me know if it stays consistently elevated as symptoms improve.  Patient verbalized understanding and is agreeable with this plan.

## 2022-12-14 NOTE — Progress Notes (Signed)
Acute Office Visit  Subjective:     Patient ID: Andrea Love, female    DOB: 04-18-1981, 42 y.o.   MRN: [id number]  Chief Complaint  Patient presents with   URI    HPI Patient is in today for productive cough.  Initially inhaled a lot of pollen on 11/27/2022 and developed a productive cough.  Started Medrol Dosepak on 11/29/2022 and added Augmentin and Tessalon Perles on 12/02/2022.  Chest x-ray negative at the time.  Did second Medrol Dosepak starting 12/06/2022 and added rescue inhaler plus Flonase nasal spray.  None of these medications have been helpful thus far.  Patient states her biggest concern is getting adequate sleep as she has not been able to.  Over the entire course of her symptoms, she feels that she has not noticed any improvement.  Review of Systems  Constitutional:  Negative for chills, fever and malaise/fatigue.  HENT:  Negative for congestion, ear discharge, ear pain, hearing loss, sinus pain, sore throat and tinnitus.   Respiratory:  Positive for cough and sputum production. Negative for shortness of breath.   Cardiovascular:  Negative for chest pain and palpitations.  Gastrointestinal:  Negative for abdominal pain, constipation, diarrhea, nausea and vomiting.  Neurological:  Negative for headaches.     Objective:    BP (!) 162/94 (BP Location: Left Arm, Patient Position: Sitting, Cuff Size: Normal)   Pulse (!) 107   Resp 18   Ht 5' 1.5" (1.562 m)   Wt 195 lb (88.5 kg)   SpO2 92%   BMI 36.25 kg/m   Physical Exam Constitutional:      General: She is not in acute distress.    Appearance: Normal appearance. She is not ill-appearing.  HENT:     Head: Normocephalic and atraumatic.     Right Ear: Tympanic membrane, ear canal and external ear normal.     Left Ear: Tympanic membrane, ear canal and external ear normal.     Nose: Nose normal. No congestion or rhinorrhea.     Mouth/Throat:     Mouth: Mucous membranes are moist.     Pharynx: Oropharynx is clear.  No oropharyngeal exudate or posterior oropharyngeal erythema.  Eyes:     General:        Right eye: No discharge.        Left eye: No discharge.     Conjunctiva/sclera: Conjunctivae normal.     Pupils: Pupils are equal, round, and reactive to light.  Cardiovascular:     Rate and Rhythm: Normal rate and regular rhythm.     Heart sounds: Normal heart sounds. No murmur heard.    No friction rub. No gallop.  Pulmonary:     Effort: Pulmonary effort is normal.     Breath sounds: Normal breath sounds. No wheezing, rhonchi or rales.  Chest:     Chest wall: No tenderness.  Musculoskeletal:     Cervical back: Normal range of motion.  Neurological:     Mental Status: She is alert.      Assessment & Plan:  Acute cough -     guaiFENesin-Codeine; Take 10 mLs by mouth at bedtime for 10 days.  Dispense: 100 mL; Refill: 0 -     guaiFENesin ER; Take 2 tablets (1,200 mg total) by mouth every morning.  Dispense: 120 tablet; Refill: 0 -     Cefpodoxime Proxetil; Take 1 tablet (200 mg total) by mouth 2 (two) times daily for 7 days.  Dispense: 14 tablet; Refill: 0 -  Azithromycin; Take 2 tablets on day 1, then 1 tablet daily on days 2 through 5  Dispense: 6 tablet; Refill: 0  Bacterial URI -     guaiFENesin-Codeine; Take 10 mLs by mouth at bedtime for 10 days.  Dispense: 100 mL; Refill: 0 -     guaiFENesin ER; Take 2 tablets (1,200 mg total) by mouth every morning.  Dispense: 120 tablet; Refill: 0 -     Cefpodoxime Proxetil; Take 1 tablet (200 mg total) by mouth 2 (two) times daily for 7 days.  Dispense: 14 tablet; Refill: 0 -     Azithromycin; Take 2 tablets on day 1, then 1 tablet daily on days 2 through 5  Dispense: 6 tablet; Refill: 0  Primary hypertension Assessment & Plan: Blood pressure has been elevated during the course of her symptoms, was previously stable.  Advised her to continue ambulatory blood pressure monitoring and to let me know if it stays consistently elevated as symptoms  improve.  Patient verbalized understanding and is agreeable with this plan.   Symptoms have been ongoing for long enough that suspect that some sort of bacterial infection is present.  Lungs were clear to auscultation bilaterally with no adventitious lung sounds or decreased breath sounds, and chest x-ray showed no abnormalities 1 week ago.  We discussed that given that she is having such a productive cough it is important that we do ensure that she is able to adequately get rid of the mucus, so we do need her to be coughing to some degree.  Recommend using Mucinex as expectorant, we discussed the importance of staying adequately hydrated while using this medication.  The morning dose will be guaifenesin alone as an expectorant, nighttime dose will be guaifenesin-codeine to help with sleep.  Additionally, to take care of any bacterial infection starting combination of cefpodoxime 200 mg twice a day for 7 days and azithromycin 5-day course.  If this does not alleviate symptoms, it may be necessary to involve pulmonology at that point.  Return if symptoms worsen or fail to improve.  I spent 35 minutes on the day of the encounter to include pre-visit record review, face-to-face time with the patient obtaining history and providing counseling, and post visit ordering of prescriptions.  Melida Quitter, PA

## 2022-12-14 NOTE — Patient Instructions (Signed)
Drink a TON of water while taking these medicines.   Your nightly dose includes codeine which will help you sleep and prevent you from coughing at night. The morning dose includes only the Mucinex (guaifenesin) portion to help clear the mucus without making you tired.

## 2022-12-15 ENCOUNTER — Other Ambulatory Visit: Payer: Self-pay | Admitting: Sports Medicine

## 2022-12-15 ENCOUNTER — Other Ambulatory Visit: Payer: Self-pay

## 2022-12-15 DIAGNOSIS — R0781 Pleurodynia: Secondary | ICD-10-CM

## 2022-12-17 ENCOUNTER — Encounter: Payer: Self-pay | Admitting: Family Medicine

## 2022-12-17 DIAGNOSIS — B9689 Other specified bacterial agents as the cause of diseases classified elsewhere: Secondary | ICD-10-CM

## 2022-12-17 DIAGNOSIS — R051 Acute cough: Secondary | ICD-10-CM

## 2022-12-19 ENCOUNTER — Emergency Department (HOSPITAL_COMMUNITY): Payer: 59

## 2022-12-19 ENCOUNTER — Emergency Department (HOSPITAL_COMMUNITY)
Admission: EM | Admit: 2022-12-19 | Discharge: 2022-12-19 | Disposition: A | Discharge status: Home / Self Care | Payer: 59 | Attending: Emergency Medicine | Admitting: Emergency Medicine

## 2022-12-19 ENCOUNTER — Encounter (HOSPITAL_COMMUNITY): Payer: Self-pay

## 2022-12-19 DIAGNOSIS — R059 Cough, unspecified: Secondary | ICD-10-CM | POA: Diagnosis not present

## 2022-12-19 DIAGNOSIS — I10 Essential (primary) hypertension: Secondary | ICD-10-CM | POA: Diagnosis not present

## 2022-12-19 DIAGNOSIS — S2241XA Multiple fractures of ribs, right side, initial encounter for closed fracture: Secondary | ICD-10-CM | POA: Insufficient documentation

## 2022-12-19 DIAGNOSIS — D72829 Elevated white blood cell count, unspecified: Secondary | ICD-10-CM | POA: Diagnosis not present

## 2022-12-19 DIAGNOSIS — E119 Type 2 diabetes mellitus without complications: Secondary | ICD-10-CM | POA: Insufficient documentation

## 2022-12-19 DIAGNOSIS — Z79899 Other long term (current) drug therapy: Secondary | ICD-10-CM | POA: Diagnosis not present

## 2022-12-19 DIAGNOSIS — J9811 Atelectasis: Secondary | ICD-10-CM | POA: Diagnosis not present

## 2022-12-19 DIAGNOSIS — S299XXA Unspecified injury of thorax, initial encounter: Secondary | ICD-10-CM | POA: Diagnosis present

## 2022-12-19 DIAGNOSIS — S2242XA Multiple fractures of ribs, left side, initial encounter for closed fracture: Secondary | ICD-10-CM | POA: Diagnosis not present

## 2022-12-19 DIAGNOSIS — R0602 Shortness of breath: Secondary | ICD-10-CM | POA: Diagnosis not present

## 2022-12-19 DIAGNOSIS — R052 Subacute cough: Secondary | ICD-10-CM | POA: Insufficient documentation

## 2022-12-19 DIAGNOSIS — S2243XA Multiple fractures of ribs, bilateral, initial encounter for closed fracture: Secondary | ICD-10-CM

## 2022-12-19 DIAGNOSIS — X58XXXA Exposure to other specified factors, initial encounter: Secondary | ICD-10-CM | POA: Insufficient documentation

## 2022-12-19 LAB — BASIC METABOLIC PANEL
Anion gap: 11 (ref 5–15)
BUN: 17 mg/dL (ref 6–20)
CO2: 19 mmol/L — ABNORMAL LOW (ref 22–32)
Calcium: 8.7 mg/dL — ABNORMAL LOW (ref 8.9–10.3)
Chloride: 105 mmol/L (ref 98–111)
Creatinine, Ser: 0.74 mg/dL (ref 0.44–1.00)
GFR, Estimated: >60 mL/min (ref >60)
Glucose, Bld: 83 mg/dL (ref 70–99)
Potassium: 4.2 mmol/L (ref 3.5–5.1)
Sodium: 135 mmol/L (ref 135–145)

## 2022-12-19 LAB — D-DIMER, QUANTITATIVE: D-Dimer, Quant: 0.8 ug/mL-FEU — ABNORMAL HIGH (ref 0.00–0.50)

## 2022-12-19 LAB — CBC
HCT: 41.8 % (ref 36.0–46.0)
Hemoglobin: 13.3 g/dL (ref 12.0–15.0)
MCH: 27.9 pg (ref 26.0–34.0)
MCHC: 31.8 g/dL (ref 30.0–36.0)
MCV: 87.6 fL (ref 80.0–100.0)
Platelets: 396 10*3/uL (ref 150–400)
RBC: 4.77 MIL/uL (ref 3.87–5.11)
RDW: 14.2 % (ref 11.5–15.5)
WBC: 13.6 10*3/uL — ABNORMAL HIGH (ref 4.0–10.5)
nRBC: 0 % (ref 0.0–0.2)

## 2022-12-19 LAB — HCG, QUANTITATIVE, PREGNANCY: hCG, Beta Chain, Quant, S: <1 m[IU]/mL (ref <5)

## 2022-12-19 MED ORDER — HYDROCODONE-ACETAMINOPHEN 5-325 MG PO TABS
2.0000 | ORAL_TABLET | Freq: Once | ORAL | Status: AC
  Administered 2022-12-19: 2 via ORAL
  Filled 2022-12-19: qty 2

## 2022-12-19 MED ORDER — IPRATROPIUM-ALBUTEROL 0.5-2.5 (3) MG/3ML IN SOLN
3.0000 mL | Freq: Once | RESPIRATORY_TRACT | Status: AC
  Administered 2022-12-19: 3 mL via RESPIRATORY_TRACT
  Filled 2022-12-19: qty 3

## 2022-12-19 MED ORDER — IOHEXOL 350 MG/ML SOLN
75.0000 mL | Freq: Once | INTRAVENOUS | Status: AC | PRN
  Administered 2022-12-19: 75 mL via INTRAVENOUS

## 2022-12-19 MED ORDER — MORPHINE SULFATE (PF) 4 MG/ML IV SOLN
4.0000 mg | Freq: Once | INTRAVENOUS | Status: AC
  Administered 2022-12-19: 4 mg via INTRAVENOUS
  Filled 2022-12-19: qty 1

## 2022-12-19 MED ORDER — MAGNESIUM SULFATE 2 GM/50ML IV SOLN
2.0000 g | Freq: Once | INTRAVENOUS | Status: AC
  Administered 2022-12-19: 2 g via INTRAVENOUS
  Filled 2022-12-19: qty 50

## 2022-12-19 MED ORDER — METHYLPREDNISOLONE SODIUM SUCC 125 MG IJ SOLR
125.0000 mg | Freq: Once | INTRAMUSCULAR | Status: AC
  Administered 2022-12-19: 125 mg via INTRAVENOUS
  Filled 2022-12-19: qty 2

## 2022-12-19 MED ORDER — HYDROCODONE-ACETAMINOPHEN 5-325 MG PO TABS
1.0000 | ORAL_TABLET | ORAL | 0 refills | Status: DC | PRN
  Filled 2022-12-19: qty 10, 1d supply, fill #0
  Filled 2022-12-20: qty 10, 2d supply, fill #0

## 2022-12-19 MED ORDER — SODIUM CHLORIDE (PF) 0.9 % IJ SOLN
INTRAMUSCULAR | Status: AC
  Filled 2022-12-19: qty 50

## 2022-12-19 NOTE — Discharge Instructions (Addendum)
You are seen in the ER today for ongoing cough and chest discomfort.  As we discussed you coughed yourself to the point of a few rib fractures.  You have fractures in the right eighth rib, and bilateral ninth ribs. I looked through your previous prescriptions and the cough medicine I was going to order you is very similar to the one your doctor already prescribed.  For the guaifenesin-codeine syrup you were prescribed I recommend you take this differently: 10 mL every 12 hours.  I'm writing you some additional pain medicine for breakthrough pain. Please continue to use the incentive spirometer.   Continue to monitor how you're doing and return to the ER for new or worsening symptoms.

## 2022-12-19 NOTE — ED Notes (Signed)
CT notified that patient states she is able to tolerate lying flat now

## 2022-12-19 NOTE — ED Provider Notes (Signed)
[place] [facility] DE[facility]MENT AT [facility] Provider Note   CSN: [id number] Arrival date & time: 12/19/22  1031     History {Add pertinent medical, surgical, social history, OB history to HPI:1} Chief Complaint  Patient presents with   Cough    Andrea Love is a 42 y.o. female with history of diabetes, hypertension, migraines who presents the emergency department complaining of productive cough for the past 3 weeks.  Often, green sputum.  States that she is finished 2 rounds of Medrol Dosepak's, as well as course of antibiotics and Tessalon Perles without relief.  Mucinex is not helping.  Been feeling more short of breath, cannot complete her activities of daily living.   Cough Associated symptoms: shortness of breath        Home Medications Prior to Admission medications   Medication Sig Start Date End Date Taking? Authorizing Provider  albuterol (VENTOLIN HFA) 108 (90 Base) MCG/ACT inhaler Inhale 2 puffs into the lungs every 6 (six) hours as needed for wheezing or shortness of breath. 12/06/22   Carlean Jews, NP  ALPRAZolam Prudy Feeler) 0.5 MG tablet Take 1 tablet  by mouth 2  times daily as needed for anxiety. For MRI scan 11/23/21   Mayer Masker, PA-C  azithromycin (ZITHROMAX) 250 MG tablet Take 2 tablets on day 1, then 1 tablet daily on days 2 through 5 12/14/22 12/19/22  Melida Quitter, PA  buPROPion (WELLBUTRIN XL) 150 MG 24 hr tablet Take 1 tablet (150 mg total) by mouth daily. 10/13/22   Carlean Jews, NP  cefpodoxime (VANTIN) 200 MG tablet Take 1 tablet (200 mg total) by mouth 2 (two) times daily for 7 days. 12/14/22 12/21/22  Melida Quitter, PA  Cholecalciferol (VITAMIN D) 50 MCG (2000 UT) CAPS Take by mouth.    [provider]  cyclobenzaprine (FLEXERIL) 5 MG tablet Take 2 tablets by mouth 3 times daily as needed for muscle spasms. 03/16/22   Kerrin Champagne, MD  fluticasone (FLONASE) 50 MCG/ACT nasal spray Place 2 sprays into both nostrils  daily. 12/06/22   Carlean Jews, NP  guaiFENesin (MUCINEX) 600 MG 12 hr tablet Take 2 tablets (1,200 mg total) by mouth every morning. 12/14/22   Saralyn Pilar A, PA  guaiFENesin-codeine 100-10 MG/5ML syrup Take 10 mLs by mouth at bedtime for 10 days. 12/14/22 12/24/22  Melida Quitter, PA  levonorgestrel-ethinyl estradiol (SETLAKIN) 0.15-0.03 MG tablet Take 1 tablet by mouth daily 10/12/21     meloxicam (MOBIC) 15 MG tablet Take 1 tablet (15 mg total) by mouth daily. 12/10/22 12/10/23  Juanda Chance, NP  rizatriptan (MAXALT) 5 MG tablet Take 1 tablet  by mouth as needed for migraine. May repeat in 2 hours if needed 11/23/21   Mayer Masker, PA-C  telmisartan (MICARDIS) 20 MG tablet Take 1 tablet (20 mg total) by mouth daily. 10/13/22   Carlean Jews, NP  tiZANidine (ZANAFLEX) 4 MG tablet Take 1 tablet (4 mg total) by mouth every 6 (six) hours as needed for muscle spasms. 08/19/22   Juanda Chance, NP      Allergies    Macrobid [nitrofurantoin monohyd macro], Sulfa antibiotics, and Enalapril    Review of Systems   Review of Systems  Respiratory:  Positive for cough and shortness of breath.   All other systems reviewed and are negative.   Physical Exam Updated Vital Signs BP (!) 186/99 (BP Location: Left Arm)   Pulse (!) 112   Temp 98.2  F (36.8 C) (Oral)   Resp 20   SpO2 97%  Physical Exam  ED Results / Procedures / Treatments   Labs (all labs ordered are listed, but only abnormal results are displayed) Labs Reviewed  CBC  BASIC METABOLIC PANEL  D-DIMER, QUANTITATIVE  HCG, QUANTITATIVE, PREGNANCY    EKG None  Radiology No results found.  Procedures Procedures  {Document cardiac monitor, telemetry assessment procedure when appropriate:1}  Medications Ordered in ED Medications - No data to display  ED Course/ Medical Decision Making/ A&P   {   Click here for ABCD2, HEART and other calculatorsREFRESH Note before signing :1}                           Medical Decision Making Amount and/or Complexity of Data Reviewed Labs: ordered. Radiology: ordered.  Risk Prescription drug management.   This patient is a 42 y.o. female  who presents to the ED for concern of shortness of breath and cough for several weeks.   Differential diagnoses prior to evaluation: The emergent differential diagnosis includes, but is not limited to,  *** . This is not an exhaustive differential.   Past Medical History / Co-morbidities: ***  Additional history: Chart reviewed. Pertinent results include: Initially prescribed Medrol Dosepak on 4/8, started on Augmentin on 4/11.  Chest x-ray from 4/15 without acute findings.  Added on inhaler, nasal spray, second steroid taper.  PCP visit from 4/23 in which patient was started on guaifenesin/codeine, cefpodoxime and azithromycin.  Physical Exam: Physical exam performed. The pertinent findings include: ***  Lab Tests/Imaging studies: I personally interpreted labs/imaging and the pertinent results include:  ***. ***I agree with the radiologist interpretation.  Cardiac monitoring: EKG obtained and interpreted by my attending physician which shows: ***   Medications: I ordered medication including ***.  I have reviewed the patients home medicines and have made adjustments as needed.   Disposition: After consideration of the diagnostic results and the patients response to treatment, I feel that *** .   ***emergency department workup does not suggest an emergent condition requiring admission or immediate intervention beyond what has been performed at this time. The plan is: ***. The patient is safe for discharge and has been instructed to return immediately for worsening symptoms, change in symptoms or any other concerns.  Final Clinical Impression(s) / ED Diagnoses Final diagnoses:  None    Rx / DC Orders ED Discharge Orders     None      Portions of this report may have been transcribed using voice  recognition software. Every effort was made to ensure accuracy; however, inadvertent computerized transcription errors may be present.

## 2022-12-19 NOTE — ED Notes (Signed)
Patient complaining of shortness of breath, Lorin PA ordered to place patient on 2L [place]. Oxygen is 95% room air.

## 2022-12-19 NOTE — ED Triage Notes (Signed)
Pt has had a productive cough with green sputum x 3 weeks. Pt states that she has taken 2 rounds of medrol dosepaks, as well as augmentin and tessalon pearls without relief. Mucinex has not relieved symptoms.  Pt c/o SHOB and not being able to attend her normal activities.

## 2022-12-19 NOTE — ED Notes (Signed)
Patient educated on the use of the incentive spirometer.  Patient is able to return demonstration of its use

## 2022-12-20 ENCOUNTER — Other Ambulatory Visit: Payer: Self-pay

## 2022-12-20 ENCOUNTER — Other Ambulatory Visit: Payer: Self-pay | Admitting: Nurse Practitioner

## 2022-12-20 ENCOUNTER — Other Ambulatory Visit (HOSPITAL_COMMUNITY): Payer: Self-pay

## 2022-12-20 ENCOUNTER — Other Ambulatory Visit: Payer: Self-pay | Admitting: Physical Medicine and Rehabilitation

## 2022-12-20 ENCOUNTER — Encounter: Payer: Self-pay | Admitting: Nurse Practitioner

## 2022-12-20 DIAGNOSIS — Z3041 Encounter for surveillance of contraceptive pills: Secondary | ICD-10-CM

## 2022-12-20 MED ORDER — GUAIFENESIN-CODEINE 100-10 MG/5ML PO SOLN
10.0000 mL | Freq: Every day | ORAL | 0 refills | Status: AC
Start: 2022-12-21 — End: 2023-01-01
  Filled 2022-12-20: qty 100, 10d supply, fill #0

## 2022-12-20 MED ORDER — ONDANSETRON HCL 4 MG PO TABS
4.0000 mg | ORAL_TABLET | Freq: Three times a day (TID) | ORAL | 0 refills | Status: DC | PRN
  Filled 2022-12-20: qty 20, 7d supply, fill #0

## 2022-12-20 MED ORDER — LEVONORGEST-ETH ESTRAD 91-DAY 0.15-0.03 MG PO TABS
1.0000 | ORAL_TABLET | Freq: Every day | ORAL | 3 refills | Status: DC
  Filled 2022-12-20: qty 91, 91d supply, fill #0

## 2022-12-20 MED ORDER — LEVONORGEST-ETH ESTRAD 91-DAY 0.15-0.03 MG PO TABS
1.0000 | ORAL_TABLET | Freq: Every day | ORAL | 3 refills | Status: DC
Start: 2022-12-20 — End: 2023-04-18
  Filled 2022-12-22: qty 91, 91d supply, fill #0
  Filled 2023-03-18: qty 91, 91d supply, fill #1

## 2022-12-20 NOTE — Addendum Note (Signed)
Addended by: Saralyn Pilar on: 12/20/2022 12:17 PM   Modules accepted: Orders

## 2022-12-20 NOTE — ED Notes (Addendum)
12/20/22 0750 pt called requesting refill on Guaifenesin-codeine cough syrup. Spoke with Dr Dalene Seltzer who suggested Guaifenesin OTC. Message relayed to pt who is in agreement.

## 2022-12-21 ENCOUNTER — Other Ambulatory Visit (HOSPITAL_COMMUNITY): Payer: Self-pay

## 2022-12-21 ENCOUNTER — Other Ambulatory Visit: Payer: Self-pay | Admitting: Family Medicine

## 2022-12-21 DIAGNOSIS — S2239XD Fracture of one rib, unspecified side, subsequent encounter for fracture with routine healing: Secondary | ICD-10-CM

## 2022-12-21 MED ORDER — HYDROCODONE-ACETAMINOPHEN 5-325 MG PO TABS
1.0000 | ORAL_TABLET | ORAL | 0 refills | Status: DC | PRN
Start: 2022-12-21 — End: 2022-12-24
  Filled 2022-12-21: qty 10, 1d supply, fill #0

## 2022-12-22 ENCOUNTER — Other Ambulatory Visit (HOSPITAL_COMMUNITY): Payer: Self-pay

## 2022-12-24 ENCOUNTER — Other Ambulatory Visit (HOSPITAL_COMMUNITY): Payer: Self-pay

## 2022-12-24 ENCOUNTER — Other Ambulatory Visit: Payer: Self-pay | Admitting: Family Medicine

## 2022-12-24 DIAGNOSIS — S2239XD Fracture of one rib, unspecified side, subsequent encounter for fracture with routine healing: Secondary | ICD-10-CM

## 2022-12-24 MED ORDER — HYDROCODONE-ACETAMINOPHEN 5-325 MG PO TABS
1.0000 | ORAL_TABLET | ORAL | 0 refills | Status: DC | PRN
Start: 2022-12-24 — End: 2023-04-18
  Filled 2022-12-24: qty 30, 3d supply, fill #0

## 2023-01-02 ENCOUNTER — Other Ambulatory Visit: Payer: Self-pay

## 2023-01-10 ENCOUNTER — Encounter: Payer: Self-pay | Admitting: Family Medicine

## 2023-01-10 ENCOUNTER — Other Ambulatory Visit (HOSPITAL_COMMUNITY): Payer: Self-pay

## 2023-01-10 DIAGNOSIS — G43109 Migraine with aura, not intractable, without status migrainosus: Secondary | ICD-10-CM

## 2023-01-10 MED ORDER — RIZATRIPTAN BENZOATE 5 MG PO TABS
5.0000 mg | ORAL_TABLET | ORAL | 1 refills | Status: DC | PRN
Start: 2023-01-10 — End: 2023-04-18
  Filled 2023-01-10: qty 10, 17d supply, fill #0

## 2023-01-11 ENCOUNTER — Other Ambulatory Visit (HOSPITAL_COMMUNITY): Payer: Self-pay

## 2023-02-28 ENCOUNTER — Encounter: Payer: Self-pay | Admitting: Family Medicine

## 2023-03-18 ENCOUNTER — Other Ambulatory Visit (HOSPITAL_COMMUNITY): Payer: Self-pay

## 2023-03-18 ENCOUNTER — Other Ambulatory Visit: Payer: Self-pay

## 2023-04-18 ENCOUNTER — Other Ambulatory Visit: Payer: Self-pay

## 2023-04-18 ENCOUNTER — Ambulatory Visit (INDEPENDENT_AMBULATORY_CARE_PROVIDER_SITE_OTHER): Payer: 59 | Admitting: Family Medicine

## 2023-04-18 ENCOUNTER — Encounter: Payer: Self-pay | Admitting: Family Medicine

## 2023-04-18 ENCOUNTER — Other Ambulatory Visit (HOSPITAL_COMMUNITY): Payer: Self-pay

## 2023-04-18 VITALS — BP 118/78 | HR 76 | Resp 18 | Ht 61.5 in | Wt 177.0 lb

## 2023-04-18 DIAGNOSIS — E669 Obesity, unspecified: Secondary | ICD-10-CM

## 2023-04-18 DIAGNOSIS — Z3041 Encounter for surveillance of contraceptive pills: Secondary | ICD-10-CM

## 2023-04-18 DIAGNOSIS — I10 Essential (primary) hypertension: Secondary | ICD-10-CM | POA: Diagnosis not present

## 2023-04-18 DIAGNOSIS — R61 Generalized hyperhidrosis: Secondary | ICD-10-CM | POA: Insufficient documentation

## 2023-04-18 DIAGNOSIS — Z1159 Encounter for screening for other viral diseases: Secondary | ICD-10-CM | POA: Diagnosis not present

## 2023-04-18 DIAGNOSIS — Z6832 Body mass index (BMI) 32.0-32.9, adult: Secondary | ICD-10-CM

## 2023-04-18 DIAGNOSIS — R7303 Prediabetes: Secondary | ICD-10-CM | POA: Insufficient documentation

## 2023-04-18 DIAGNOSIS — G43109 Migraine with aura, not intractable, without status migrainosus: Secondary | ICD-10-CM

## 2023-04-18 DIAGNOSIS — Z23 Encounter for immunization: Secondary | ICD-10-CM

## 2023-04-18 DIAGNOSIS — F419 Anxiety disorder, unspecified: Secondary | ICD-10-CM | POA: Diagnosis not present

## 2023-04-18 MED ORDER — ESCITALOPRAM OXALATE 5 MG PO TABS
5.0000 mg | ORAL_TABLET | Freq: Every day | ORAL | 1 refills | Status: DC
Start: 2023-04-18 — End: 2023-10-17
  Filled 2023-04-18: qty 5, 5d supply, fill #0
  Filled 2023-04-18: qty 85, 85d supply, fill #0
  Filled 2023-04-18: qty 5, 5d supply, fill #0
  Filled 2023-04-18: qty 90, 90d supply, fill #0

## 2023-04-18 MED ORDER — LEVONORGEST-ETH ESTRAD 91-DAY 0.15-0.03 MG PO TABS
1.0000 | ORAL_TABLET | Freq: Every day | ORAL | 3 refills | Status: DC
Start: 2023-04-18 — End: 2024-04-06
  Filled 2023-04-18 – 2023-06-13 (×2): qty 91, 91d supply, fill #0
  Filled 2023-09-08 – 2023-09-09 (×4): qty 91, 91d supply, fill #1
  Filled 2023-12-18: qty 91, 91d supply, fill #2
  Filled 2024-03-13: qty 91, 91d supply, fill #3

## 2023-04-18 MED ORDER — BUPROPION HCL ER (XL) 150 MG PO TB24
150.0000 mg | ORAL_TABLET | Freq: Every day | ORAL | 3 refills | Status: DC
Start: 2023-04-18 — End: 2024-04-06
  Filled 2023-04-18 – 2023-06-13 (×2): qty 90, 90d supply, fill #0
  Filled 2023-09-08 – 2023-09-09 (×2): qty 90, 90d supply, fill #1
  Filled 2023-12-18: qty 90, 90d supply, fill #2
  Filled 2024-03-13: qty 90, 90d supply, fill #3

## 2023-04-18 MED ORDER — TELMISARTAN 20 MG PO TABS
20.0000 mg | ORAL_TABLET | Freq: Every day | ORAL | 3 refills | Status: DC
Start: 2023-04-18 — End: 2024-06-08
  Filled 2023-04-18 – 2023-06-13 (×2): qty 90, 90d supply, fill #0
  Filled 2024-01-30: qty 90, 90d supply, fill #1

## 2023-04-18 MED ORDER — RIZATRIPTAN BENZOATE 5 MG PO TABS
5.0000 mg | ORAL_TABLET | ORAL | 1 refills | Status: DC | PRN
Start: 2023-04-18 — End: 2023-10-17
  Filled 2023-04-18: qty 10, 30d supply, fill #0
  Filled 2023-04-18: qty 18, 31d supply, fill #0

## 2023-04-18 NOTE — Assessment & Plan Note (Addendum)
BP goal <140/90.  Stable, at goal.  Continue telmisartan 20 mg daily.  Will continue to monitor.

## 2023-04-18 NOTE — Assessment & Plan Note (Signed)
PHQ-9 score of 2, GAD-7 score of 7 which has increased from baseline.  We discussed options for decreasing irritability, patient would prefer to start at a very low dose.  Patient is agreeable to trial of escitalopram 5 mg daily.  We discussed potential side effects and mechanism of action.  We also discussed that since she is already taking Wellbutrin, this can be helpful in counteracting potential side effects.  We will follow-up in about 6 weeks to assess efficacy and adjust as needed.

## 2023-04-18 NOTE — Progress Notes (Signed)
Established Patient Office Visit  Subjective   Patient ID: Andrea Love, female    DOB: [birth year]  Age: 42 y.o. MRN: [id number]  Chief Complaint  Patient presents with   Hypertension   Anxiety   Depression    HPI Andrea Love is a 42 y.o. female presenting today for follow up of hypertension, mood. Hypertension: Patient here for follow-up of elevated blood pressure. She is exercising and is adherent to low salt diet.   Pt denies chest pain, SOB, dizziness, edema, syncope, fatigue or heart palpitations. Taking telmisartan, reports excellent compliance with treatment. Denies side effects. Mood: Patient is here to follow up for depression and anxiety, currently managing with Wellbutrin daily. Taking medication without side effects, reports excellent compliance with treatment. Denies mood changes or SI/HI. She feels mood is worse since last visit.  She has noticed over the past 3 months increasing irritability which is now almost daily.  She would like to discuss options to get her to a more steady baseline and feel like she is not going to snap at someone at any given moment.     04/18/2023    8:14 AM 12/14/2022    1:24 PM 10/13/2022    8:17 AM  Depression screen PHQ 2/9  Decreased Interest 0 1 1  Down, Depressed, Hopeless 0 0 0  PHQ - 2 Score 0 1 1  Altered sleeping 1 1 1   Tired, decreased energy 0 1 1  Change in appetite 0 0 0  Feeling bad or failure about yourself  0 0 0  Trouble concentrating 1 0 1  Moving slowly or fidgety/restless 0 0 1  Suicidal thoughts 0 0 0  PHQ-9 Score 2 3 5   Difficult doing work/chores Not difficult at all Somewhat difficult        04/18/2023    8:14 AM 12/14/2022    1:24 PM 10/13/2022    8:17 AM 05/25/2022    8:17 AM  GAD 7 : Generalized Anxiety Score  Nervous, Anxious, on Edge 1 0 0 0  Control/stop worrying 1 0 1 0  Worry too much - different things 1 0 1 0  Trouble relaxing 1 1 1 1   Restless 1 0 2 1  Easily annoyed or irritable 1 0 1 1   Afraid - awful might happen 1 0 1 0  Total GAD 7 Score 7 1 7 3   Anxiety Difficulty Somewhat difficult Somewhat difficult      ROS Negative unless otherwise noted in HPI   Objective:     BP 118/78 Comment: home BP readings  Pulse 76   Resp 18   Ht 5' 1.5" (1.562 m)   Wt 177 lb (80.3 kg)   SpO2 100%   BMI 32.90 kg/m   Physical Exam Constitutional:      General: She is not in acute distress.    Appearance: Normal appearance.  HENT:     Head: Normocephalic and atraumatic.  Cardiovascular:     Rate and Rhythm: Normal rate and regular rhythm.     Heart sounds: No murmur heard.    No friction rub. No gallop.  Pulmonary:     Effort: Pulmonary effort is normal. No respiratory distress.     Breath sounds: No wheezing, rhonchi or rales.  Musculoskeletal:     Cervical back: Normal range of motion.  Skin:    General: Skin is warm and dry.  Neurological:     General: No focal deficit present.  Mental Status: She is alert and oriented to person, place, and time. Mental status is at baseline.  Psychiatric:        Mood and Affect: Mood normal.        Thought Content: Thought content normal.        Judgment: Judgment normal.      Assessment & Plan:  Essential (primary) hypertension Assessment & Plan: BP goal <140/90.  Stable, at goal.  Continue telmisartan 20 mg daily.  Will continue to monitor.  Orders: -     Telmisartan; Take 1 tablet (20 mg total) by mouth daily.  Dispense: 90 tablet; Refill: 3  Anxiety Assessment & Plan: PHQ-9 score of 2, GAD-7 score of 7 which has increased from baseline.  We discussed options for decreasing irritability, patient would prefer to start at a very low dose.  Patient is agreeable to trial of escitalopram 5 mg daily.  We discussed potential side effects and mechanism of action.  We also discussed that since she is already taking Wellbutrin, this can be helpful in counteracting potential side effects.  We will follow-up in about 6 weeks to  assess efficacy and adjust as needed.  Orders: -     Escitalopram Oxalate; Take 1 tablet (5 mg total) by mouth daily.  Dispense: 90 tablet; Refill: 1 -     buPROPion HCl ER (XL); Take 1 tablet (150 mg total) by mouth daily.  Dispense: 90 tablet; Refill: 3  Encounter for birth control pills maintenance -     Levonorgest-Eth Estrad 91-Day; Take 1 tablet by mouth daily.  Dispense: 91 tablet; Refill: 3  Need for Tdap vaccination -     Tdap vaccine greater than or equal to 41yo IM  Migraine with aura and without status migrainosus, not intractable -     Rizatriptan Benzoate; Take 1 tablet  by mouth as needed for migraine. May repeat in 2 hours if needed  Dispense: 10 tablet; Refill: 1  Screening for viral disease -     Hepatitis C antibody; Future -     HIV Antibody (routine testing w rflx); Future  Provided refills of all medications today.  She is agreeable to having hepatitis C and HIV screenings done with lab work before annual physical in about 6 months.  Return in about 6 weeks (around 05/30/2023) for follow-up for mood after starting Lexapro, in person or video.  Return in about 6 months for annual physical with fasting labs 1 week before.   Melida Quitter, PA

## 2023-04-19 ENCOUNTER — Other Ambulatory Visit (HOSPITAL_COMMUNITY): Payer: Self-pay

## 2023-06-08 ENCOUNTER — Telehealth (INDEPENDENT_AMBULATORY_CARE_PROVIDER_SITE_OTHER): Payer: 59 | Admitting: Family Medicine

## 2023-06-08 ENCOUNTER — Encounter: Payer: Self-pay | Admitting: Family Medicine

## 2023-06-08 DIAGNOSIS — F419 Anxiety disorder, unspecified: Secondary | ICD-10-CM | POA: Diagnosis not present

## 2023-06-08 NOTE — Assessment & Plan Note (Signed)
PHQ-9 score of 2, GAD-7 score of 7 which is stable from last appointment.  Both scores have not changed, patient reports improvement in symptoms.  Continue Lexapro 2.5 mg daily, Wellbutrin 150 mg daily.  We discussed that if she decides to trial Lexapro 5 mg daily again, it is safe to do so.  Follow-up at previously scheduled annual physical appointment in February, she is aware that if anything changes prior to that appointment she can send a MyChart message or schedule an appointment.

## 2023-06-08 NOTE — Progress Notes (Signed)
Virtual Visit via Video Note  I connected with Andrea Love on 06/08/23 at  1:10 PM EDT by a video enabled telemedicine application and verified that I am speaking with the correct person using two identifiers.  Patient Location: Home Provider Location: Office/clinic  I discussed the limitations, risks, security, and privacy concerns of performing an evaluation and management service by video and the availability of in person appointments. I also discussed with the patient that there may be a patient responsible charge related to this service. The patient expressed understanding and agreed to proceed.    Subjective   Patient ID: Andrea Love, female    DOB: 1981-08-04  Age: 42 y.o. MRN: [id number]  No chief complaint on file.   HPI Andrea Love is a 42 y.o. female presenting today for follow up of mood after starting Lexapro 2.5 mg daily in addition to Wellbutrin 150 mg daily.  She initially started Lexapro at 5 mg daily but developed increased sweating and started taking half a tablet.  The sweating resolved and she is still had improvement in her mood symptoms with a low-dose of Lexapro.  She finds that her anxiety, sleep, concentration, and irritability have improved but not completely resolved.  She still experiences symptoms on some days, but less than before she was taking Lexapro.  ROS Negative unless otherwise noted in HPI  Outpatient Medications Prior to Visit  Medication Sig   albuterol (VENTOLIN HFA) 108 (90 Base) MCG/ACT inhaler Inhale 2 puffs into the lungs every 6 (six) hours as needed for wheezing or shortness of breath.   ALPRAZolam (XANAX) 0.5 MG tablet Take 1 tablet  by mouth 2  times daily as needed for anxiety. For MRI scan   buPROPion (WELLBUTRIN XL) 150 MG 24 hr tablet Take 1 tablet (150 mg total) by mouth daily.   Cholecalciferol (VITAMIN D) 50 MCG (2000 UT) CAPS Take by mouth.   cyclobenzaprine (FLEXERIL) 5 MG tablet Take 2 tablets by mouth 3 times  daily as needed for muscle spasms.   escitalopram (LEXAPRO) 5 MG tablet Take 1 tablet (5 mg total) by mouth daily.   levonorgestrel-ethinyl estradiol (SETLAKIN) 0.15-0.03 MG tablet Take 1 tablet by mouth daily.   meloxicam (MOBIC) 15 MG tablet Take 1 tablet (15 mg total) by mouth daily.   rizatriptan (MAXALT) 5 MG tablet Take 1 tablet  by mouth as needed for migraine. May repeat in 2 hours if needed   telmisartan (MICARDIS) 20 MG tablet Take 1 tablet (20 mg total) by mouth daily.   tiZANidine (ZANAFLEX) 4 MG tablet Take 1 tablet (4 mg total) by mouth every 6 (six) hours as needed for muscle spasms.   No facility-administered medications prior to visit.     Objective:     Physical Exam General: Speaking clearly in complete sentences without any shortness of breath.  Alert and oriented x3.  Normal judgment. No apparent acute distress.   Assessment & Plan:  Anxiety Assessment & Plan: PHQ-9 score of 2, GAD-7 score of 7 which is stable from last appointment.  Both scores have not changed, patient reports improvement in symptoms.  Continue Lexapro 2.5 mg daily, Wellbutrin 150 mg daily.  We discussed that if she decides to trial Lexapro 5 mg daily again, it is safe to do so.  Follow-up at previously scheduled annual physical appointment in February, she is aware that if anything changes prior to that appointment she can send a MyChart message or schedule an appointment.  Return as scheduled for annual physical on 10/17/2023.   I discussed the assessment and treatment plan with the patient. The patient was provided an opportunity to ask questions, and all were answered. The patient agreed with the plan and demonstrated an understanding of the instructions.   The patient was advised to call back or seek an in-person evaluation if the symptoms worsen or if the condition fails to improve as anticipated.  The above assessment and management plan was discussed with the patient. The patient  verbalized understanding of and has agreed to the management plan.   Melida Quitter, PA

## 2023-06-13 ENCOUNTER — Other Ambulatory Visit (HOSPITAL_COMMUNITY): Payer: Self-pay

## 2023-06-13 ENCOUNTER — Other Ambulatory Visit: Payer: Self-pay

## 2023-06-16 ENCOUNTER — Other Ambulatory Visit (HOSPITAL_COMMUNITY): Payer: Self-pay

## 2023-07-04 DIAGNOSIS — Z0100 Encounter for examination of eyes and vision without abnormal findings: Secondary | ICD-10-CM | POA: Diagnosis not present

## 2023-08-01 ENCOUNTER — Other Ambulatory Visit: Payer: Self-pay | Admitting: Physical Medicine and Rehabilitation

## 2023-08-01 ENCOUNTER — Other Ambulatory Visit: Payer: Self-pay

## 2023-08-01 ENCOUNTER — Other Ambulatory Visit (HOSPITAL_COMMUNITY): Payer: Self-pay

## 2023-08-01 MED ORDER — AMOXICILLIN-POT CLAVULANATE 875-125 MG PO TABS
1.0000 | ORAL_TABLET | Freq: Two times a day (BID) | ORAL | 0 refills | Status: DC
  Filled 2023-08-01 (×2): qty 14, 7d supply, fill #0

## 2023-08-02 ENCOUNTER — Other Ambulatory Visit (HOSPITAL_COMMUNITY): Payer: Self-pay

## 2023-08-12 ENCOUNTER — Encounter: Payer: Self-pay | Admitting: Family Medicine

## 2023-08-12 ENCOUNTER — Telehealth: Payer: Self-pay | Admitting: *Deleted

## 2023-08-12 ENCOUNTER — Other Ambulatory Visit (HOSPITAL_COMMUNITY): Payer: Self-pay

## 2023-08-12 DIAGNOSIS — R051 Acute cough: Secondary | ICD-10-CM

## 2023-08-12 DIAGNOSIS — B9689 Other specified bacterial agents as the cause of diseases classified elsewhere: Secondary | ICD-10-CM

## 2023-08-12 MED ORDER — CEFPODOXIME PROXETIL 200 MG PO TABS
200.0000 mg | ORAL_TABLET | Freq: Two times a day (BID) | ORAL | 0 refills | Status: AC
Start: 2023-08-12 — End: 2023-08-19
  Filled 2023-08-12: qty 14, 7d supply, fill #0

## 2023-08-12 MED ORDER — AZITHROMYCIN 250 MG PO TABS
ORAL_TABLET | ORAL | 0 refills | Status: AC
Start: 2023-08-12 — End: 2023-08-17
  Filled 2023-08-12: qty 6, 5d supply, fill #0

## 2023-08-12 NOTE — Addendum Note (Signed)
Addended by: Saralyn Pilar on: 08/12/2023 10:10 AM   Modules accepted: Orders

## 2023-08-12 NOTE — Telephone Encounter (Signed)
Pt called to have [place] contact billing to place her bills on hold until [facility] and Insurance problem with provider and copays are taken care of so she does not get sent to collections.

## 2023-08-12 NOTE — Addendum Note (Signed)
Addended by: Saralyn Pilar on: 08/12/2023 10:31 AM   Modules accepted: Orders

## 2023-08-15 ENCOUNTER — Ambulatory Visit: Payer: Self-pay | Admitting: Family Medicine

## 2023-08-15 ENCOUNTER — Other Ambulatory Visit: Payer: Self-pay | Admitting: Physical Medicine and Rehabilitation

## 2023-08-15 ENCOUNTER — Other Ambulatory Visit (HOSPITAL_COMMUNITY): Payer: Self-pay

## 2023-08-15 MED ORDER — ONDANSETRON HCL 4 MG PO TABS
4.0000 mg | ORAL_TABLET | Freq: Three times a day (TID) | ORAL | 0 refills | Status: DC | PRN
  Filled 2023-08-15: qty 20, 7d supply, fill #0

## 2023-08-15 NOTE — Telephone Encounter (Signed)
Copied from CRM [id number]. Topic: Clinical - Pink Word Triage >> Aug 15, 2023  9:48 AM Elle L wrote: Reason for Triage: Reason for CRM: The patient is having severe diarrhea from her antibiotic, azithromycin (ZITHROMAX) 250 MG tablet ,  and has lost four pounds over the weekend.   Chief Complaint: Diarrhea  Symptoms: Diarrhea, stomach cramping, nausea  Frequency: Frequent, more than 7 times a day Pertinent Negatives: Patient denies fever, vomiting, sick exposure Disposition: [] ED /[x] Urgent Care (no appt availability in office) / [] Appointment(In office/virtual)/ []  [facility]/ [] Home Care/ [] Refused Recommended Disposition /[] [place] [facility]/ []  Follow-up with PCP Additional Notes: Patient reports she began to experience diarrhea Saturday morning. She reports that on Friday she began taking Zithromax. She reports that since her diarrhea started she has had 40-45 episodes of diarrhea and has lost approximately 4 pounds. Patient reports she has been drinking fluids and is still making urine. Patient advised that there are no appointments available in office and to follow up with urgent care. Patient also advised on when to go to the ED. Patient understood and is agreeable with this plan. Patient reports she has also sent a message to her provider on MyChart about these symptoms.    Reason for Disposition  SEVERE diarrhea (e.g., 7 or more times / day more than normal)  Answer Assessment - Initial Assessment Questions 1. ANTIBIOTIC: "What antibiotic are you taking?" "How many times per day?"     Azithromycin 2. ANTIBIOTIC ONSET: "When was the antibiotic started?"     3 days ago 3. DIARRHEA SEVERITY: "How bad is the diarrhea?" "How many more stools have you had in the past 24 hours than normal?"    - NO DIARRHEA (SCALE 0)   - MILD (SCALE 1-3): Few loose or mushy BMs; increase of 1-3 stools over normal daily number of stools; mild increase in ostomy output.   -  MODERATE (SCALE  4-7): Increase of 4-6 stools daily over normal; moderate increase in ostomy output. * SEVERE (SCALE 8-10; OR 'WORST POSSIBLE'): Increase of 7 or more stools daily over normal; moderate increase in ostomy output; incontinence.     More than 7 times in 24 hours 4. ONSET: "When did the diarrhea begin?"      2 days ago  5. BM CONSISTENCY: "How loose or watery is the diarrhea?"      Watery 6. VOMITING: "Are you also vomiting?" If Yes, ask: "How many times in the past 24 hours?"      No 7. ABDOMEN PAIN: "Are you having any abdomen pain?" If Yes, ask: "What does it feel like?" (e.g., crampy, dull, intermittent, constant)      Intermittent crampy, worse after eating 8. ABDOMEN PAIN SEVERITY: If present, ask: "How bad is the pain?"  (e.g., Scale 1-10; mild, moderate, or severe)   - MILD (1-3): doesn't interfere with normal activities, abdomen soft and not tender to touch    - MODERATE (4-7): interferes with normal activities or awakens from sleep, abdomen tender to touch    - SEVERE (8-10): excruciating pain, doubled over, unable to do any normal activities       3/10 9. ORAL INTAKE: If vomiting, "Have you been able to drink liquids?" "How much liquids have you had in the past 24 hours?"     Eating and drinking 10. HYDRATION: "Any signs of dehydration?" (e.g., dry mouth [not just dry lips], too weak to stand, dizziness, new weight loss) "When did you last urinate?"  Dry mouth, weight loss, last urinated within the last hour 11. EXPOSURE: "Have you traveled to a foreign country recently?" "Have you been exposed to anyone with diarrhea?" "Could you have eaten any food that was spoiled?"       No 12. OTHER SYMPTOMS: "Do you have any other symptoms?" (e.g., fever, blood in stool)       No 13. PREGNANCY: "Is there any chance you are pregnant?" "When was your last menstrual period?"       No  Protocols used: Diarrhea on Antibiotics-A-AH

## 2023-09-08 ENCOUNTER — Encounter: Payer: Self-pay | Admitting: Family Medicine

## 2023-09-09 ENCOUNTER — Encounter: Payer: Self-pay | Admitting: Family Medicine

## 2023-09-09 ENCOUNTER — Other Ambulatory Visit (HOSPITAL_COMMUNITY): Payer: Self-pay

## 2023-09-09 ENCOUNTER — Ambulatory Visit (INDEPENDENT_AMBULATORY_CARE_PROVIDER_SITE_OTHER): Payer: Commercial Managed Care - PPO | Admitting: Family Medicine

## 2023-09-09 ENCOUNTER — Other Ambulatory Visit: Payer: Self-pay

## 2023-09-09 ENCOUNTER — Other Ambulatory Visit (HOSPITAL_BASED_OUTPATIENT_CLINIC_OR_DEPARTMENT_OTHER): Payer: Self-pay

## 2023-09-09 VITALS — BP 131/89 | HR 78 | Ht 61.5 in | Wt 163.1 lb

## 2023-09-09 DIAGNOSIS — B9689 Other specified bacterial agents as the cause of diseases classified elsewhere: Secondary | ICD-10-CM | POA: Diagnosis not present

## 2023-09-09 DIAGNOSIS — J019 Acute sinusitis, unspecified: Secondary | ICD-10-CM | POA: Diagnosis not present

## 2023-09-09 MED ORDER — AMOXICILLIN-POT CLAVULANATE ER 1000-62.5 MG PO TB12
2.0000 | ORAL_TABLET | Freq: Two times a day (BID) | ORAL | 0 refills | Status: AC
Start: 2023-09-09 — End: 2023-09-16
  Filled 2023-09-09: qty 28, 7d supply, fill #0

## 2023-09-09 NOTE — Patient Instructions (Signed)
Switch your Claritin to Allegra.  I am sending a very strong dose of Augmentin.  If your symptoms have not improved by the time you finish the antibiotics, please let me know.  At that point, I would recommend seeing an ENT.

## 2023-09-09 NOTE — Progress Notes (Signed)
   Acute Office Visit  Subjective:     Patient ID: Andrea Love, female    DOB: 1981/06/06, 43 y.o.   MRN: [id number]  Chief Complaint  Patient presents with   Sinus Problem   Facial Pain    Sinus Problem   Patient is in today for possible sinus infection.  She endorses right-sided facial pain, sinus pressure and states that her teeth are throbbing.  She takes Claritin daily and has been using Nettie pot and Flonase daily.  She endorses cough that sometimes causes a needlelike sensation in her throat.  Symptoms have been getting worse since they first started about 5 days ago.  She did have a course of Augmentin and azithromycin about a month ago for similar symptoms.  The azithromycin caused some diarrhea.  Denies fever, chills, GI upset in the past 5 days.  ROS See HPI    Objective:    BP 131/89   Pulse 78   Ht 5' 1.5" (1.562 m)   Wt 163 lb 1.9 oz (74 kg)   SpO2 (!) 78%   BMI 30.32 kg/m   Physical Exam Constitutional:      General: She is not in acute distress.    Appearance: Normal appearance. She is not ill-appearing.  HENT:     Head: Normocephalic and atraumatic.     Right Ear: Tympanic membrane, ear canal and external ear normal. There is no impacted cerumen.     Left Ear: Tympanic membrane, ear canal and external ear normal. There is no impacted cerumen.     Nose: Congestion present. No rhinorrhea.     Right Sinus: Maxillary sinus tenderness present. No frontal sinus tenderness.     Left Sinus: No maxillary sinus tenderness or frontal sinus tenderness.     Mouth/Throat:     Mouth: Mucous membranes are moist.     Pharynx: Oropharynx is clear. No oropharyngeal exudate or posterior oropharyngeal erythema.  Eyes:     General:        Right eye: No discharge.        Left eye: No discharge.     Conjunctiva/sclera: Conjunctivae normal.     Pupils: Pupils are equal, round, and reactive to light.  Cardiovascular:     Rate and Rhythm: Normal rate and regular rhythm.      Heart sounds: No murmur heard.    No friction rub. No gallop.  Pulmonary:     Effort: Pulmonary effort is normal. No respiratory distress.     Breath sounds: Normal breath sounds. No wheezing, rhonchi or rales.  Skin:    General: Skin is warm and dry.  Neurological:     Mental Status: She is alert and oriented to person, place, and time.       Assessment & Plan:  Acute bacterial sinusitis -     Amoxicillin-Pot Clavulanate ER; Take 2 tablets by mouth 2 (two) times daily for 7 days.  Dispense: 28 tablet; Refill: 0  Given use of antibiotics in the past month, start high dose of Augmentin 2000-125 mg twice daily for 7 days.  If symptoms do not resolve, recommend respiratory fluoroquinolone instead.  Also recommend changing Claritin to Allegra to prevent future infections.  If this infection does not resolve, or if she gets another sinus infection in the next few months, recommend referral to ENT for further evaluation of recurrent sinusitis.  Return if symptoms worsen or fail to improve.  Melida Quitter, PA

## 2023-09-13 ENCOUNTER — Other Ambulatory Visit (HOSPITAL_COMMUNITY): Payer: Self-pay

## 2023-09-14 ENCOUNTER — Other Ambulatory Visit (HOSPITAL_COMMUNITY): Payer: Self-pay

## 2023-09-14 ENCOUNTER — Other Ambulatory Visit: Payer: Self-pay | Admitting: Family

## 2023-09-14 MED ORDER — FLUCONAZOLE 150 MG PO TABS
150.0000 mg | ORAL_TABLET | Freq: Once | ORAL | 0 refills | Status: AC
  Filled 2023-09-14: qty 5, 5d supply, fill #0

## 2023-09-21 ENCOUNTER — Other Ambulatory Visit (HOSPITAL_COMMUNITY): Payer: Self-pay

## 2023-09-21 ENCOUNTER — Other Ambulatory Visit: Payer: Self-pay | Admitting: Physical Medicine and Rehabilitation

## 2023-09-21 MED ORDER — CIPROFLOXACIN HCL 250 MG PO TABS
250.0000 mg | ORAL_TABLET | Freq: Two times a day (BID) | ORAL | 0 refills | Status: AC
  Filled 2023-09-21: qty 14, 7d supply, fill #0

## 2023-09-29 ENCOUNTER — Encounter: Payer: Self-pay | Admitting: Family Medicine

## 2023-10-13 ENCOUNTER — Other Ambulatory Visit: Payer: 59

## 2023-10-14 ENCOUNTER — Other Ambulatory Visit: Payer: Commercial Managed Care - PPO

## 2023-10-14 DIAGNOSIS — Z1159 Encounter for screening for other viral diseases: Secondary | ICD-10-CM

## 2023-10-14 DIAGNOSIS — Z6832 Body mass index (BMI) 32.0-32.9, adult: Secondary | ICD-10-CM | POA: Diagnosis not present

## 2023-10-14 DIAGNOSIS — E66811 Obesity, class 1: Secondary | ICD-10-CM | POA: Diagnosis not present

## 2023-10-15 LAB — LIPID PANEL
Chol/HDL Ratio: 3.7 {ratio} (ref 0.0–4.4)
Cholesterol, Total: 145 mg/dL (ref 100–199)
HDL: 39 mg/dL — ABNORMAL LOW (ref >39)
LDL Chol Calc (NIH): 90 mg/dL (ref 0–99)
Triglycerides: 83 mg/dL (ref 0–149)
VLDL Cholesterol Cal: 16 mg/dL (ref 5–40)

## 2023-10-15 LAB — COMPREHENSIVE METABOLIC PANEL
ALT: 12 [IU]/L (ref 0–32)
AST: 14 [IU]/L (ref 0–40)
Albumin: 4.2 g/dL (ref 3.9–4.9)
Alkaline Phosphatase: 70 [IU]/L (ref 44–121)
BUN/Creatinine Ratio: 18 (ref 9–23)
BUN: 14 mg/dL (ref 6–24)
Bilirubin Total: <0.2 mg/dL (ref 0.0–1.2)
CO2: 20 mmol/L (ref 20–29)
Calcium: 8.7 mg/dL (ref 8.7–10.2)
Chloride: 106 mmol/L (ref 96–106)
Creatinine, Ser: 0.79 mg/dL (ref 0.57–1.00)
Globulin, Total: 2.8 g/dL (ref 1.5–4.5)
Glucose: 78 mg/dL (ref 70–99)
Potassium: 4.4 mmol/L (ref 3.5–5.2)
Sodium: 140 mmol/L (ref 134–144)
Total Protein: 7 g/dL (ref 6.0–8.5)
eGFR: 96 mL/min/{1.73_m2} (ref >59)

## 2023-10-15 LAB — HEPATITIS C ANTIBODY: Hep C Virus Ab: NONREACTIVE

## 2023-10-15 LAB — CBC WITH DIFFERENTIAL/PLATELET
Basophils Absolute: 0.1 10*3/uL (ref 0.0–0.2)
Basos: 1 %
EOS (ABSOLUTE): 0.1 10*3/uL (ref 0.0–0.4)
Eos: 2 %
Hematocrit: 41.5 % (ref 34.0–46.6)
Hemoglobin: 13.6 g/dL (ref 11.1–15.9)
Immature Grans (Abs): 0 10*3/uL (ref 0.0–0.1)
Immature Granulocytes: 0 %
Lymphocytes Absolute: 1.3 10*3/uL (ref 0.7–3.1)
Lymphs: 16 %
MCH: 29.3 pg (ref 26.6–33.0)
MCHC: 32.8 g/dL (ref 31.5–35.7)
MCV: 89 fL (ref 79–97)
Monocytes Absolute: 0.6 10*3/uL (ref 0.1–0.9)
Monocytes: 7 %
Neutrophils Absolute: 6 10*3/uL (ref 1.4–7.0)
Neutrophils: 74 %
Platelets: 384 10*3/uL (ref 150–450)
RBC: 4.64 x10E6/uL (ref 3.77–5.28)
RDW: 12.8 % (ref 11.7–15.4)
WBC: 8 10*3/uL (ref 3.4–10.8)

## 2023-10-15 LAB — TSH RFX ON ABNORMAL TO FREE T4: TSH: 1.51 u[IU]/mL (ref 0.450–4.500)

## 2023-10-15 LAB — HEMOGLOBIN A1C
Est. average glucose Bld gHb Est-mCnc: 111 mg/dL
Hgb A1c MFr Bld: 5.5 % (ref 4.8–5.6)

## 2023-10-15 LAB — VITAMIN D 25 HYDROXY (VIT D DEFICIENCY, FRACTURES): Vit D, 25-Hydroxy: 61.7 ng/mL (ref 30.0–100.0)

## 2023-10-15 LAB — HIV ANTIBODY (ROUTINE TESTING W REFLEX): HIV Screen 4th Generation wRfx: NONREACTIVE

## 2023-10-17 ENCOUNTER — Ambulatory Visit (INDEPENDENT_AMBULATORY_CARE_PROVIDER_SITE_OTHER): Payer: Commercial Managed Care - PPO | Admitting: Family Medicine

## 2023-10-17 ENCOUNTER — Other Ambulatory Visit (HOSPITAL_COMMUNITY): Payer: Self-pay

## 2023-10-17 ENCOUNTER — Encounter: Payer: Self-pay | Admitting: Family Medicine

## 2023-10-17 VITALS — BP 132/82 | HR 86 | Temp 97.6°F | Ht 61.5 in | Wt 174.0 lb

## 2023-10-17 DIAGNOSIS — F419 Anxiety disorder, unspecified: Secondary | ICD-10-CM

## 2023-10-17 DIAGNOSIS — Z0000 Encounter for general adult medical examination without abnormal findings: Secondary | ICD-10-CM | POA: Diagnosis not present

## 2023-10-17 DIAGNOSIS — G43109 Migraine with aura, not intractable, without status migrainosus: Secondary | ICD-10-CM | POA: Diagnosis not present

## 2023-10-17 MED ORDER — RIZATRIPTAN BENZOATE 5 MG PO TABS
5.0000 mg | ORAL_TABLET | ORAL | 1 refills | Status: AC | PRN
  Filled 2023-10-17: qty 18, 30d supply, fill #0

## 2023-10-17 MED ORDER — ESCITALOPRAM OXALATE 5 MG PO TABS
5.0000 mg | ORAL_TABLET | Freq: Every day | ORAL | 1 refills | Status: DC
  Filled 2023-10-17: qty 90, 90d supply, fill #0

## 2023-10-17 NOTE — Progress Notes (Signed)
 Complete physical exam  Patient: Andrea Love   DOB: 04/02/1981   43 y.o. Female  MRN: [id number]  Subjective:    Chief Complaint  Patient presents with   Annual Exam    Andrea Love is a 43 y.o. female who presents today for a complete physical exam. She reports consuming a general diet.  She generally feels well. She reports sleeping fairly well. She does not have additional problems to discuss today.    Most recent fall risk assessment:    10/17/2023    4:22 PM  Fall Risk   Falls in the past year? 0  Number falls in past yr: 0  Injury with Fall? 0  Risk for fall due to : No Fall Risks  Follow up Falls evaluation completed     Most recent depression and anxiety screenings:    10/17/2023    4:22 PM 09/09/2023   10:23 AM  PHQ 2/9 Scores  PHQ - 2 Score 0 0  PHQ- 9 Score 2 1      10/17/2023    4:22 PM 06/08/2023    1:15 PM 04/18/2023    8:14 AM 12/14/2022    1:24 PM  GAD 7 : Generalized Anxiety Score  Nervous, Anxious, on Edge 0 1 1 0  Control/stop worrying 0 1 1 0  Worry too much - different things 0 1 1 0  Trouble relaxing 1 1 1 1   Restless 1 1 1  0  Easily annoyed or irritable 0 1 1 0  Afraid - awful might happen 0 1 1 0  Total GAD 7 Score 2 7 7 1   Anxiety Difficulty Not difficult at all Somewhat difficult Somewhat difficult Somewhat difficult    Patient Active Problem List   Diagnosis Date Noted   Hyperhidrosis 04/18/2023   Prediabetes 04/18/2023   Vitamin D deficiency 11/13/2022   Hormone imbalance 11/13/2022   Anxiety 11/23/2021   Essential (primary) hypertension 11/23/2021   Migraine with aura and without status migrainosus, not intractable 11/23/2021    Past Surgical History:  Procedure Laterality Date   CESAREAN SECTION  10/13/2009   MOUTH SURGERY  12/2006   wisdom teeth   Social History   Tobacco Use   Smoking status: Never    Passive exposure: Never   Smokeless tobacco: Never  Substance Use Topics   Alcohol use: Yes     Alcohol/week: 0.5 standard drinks of alcohol    Types: 1 Standard drinks or equivalent per week   Drug use: No   Family History  Problem Relation Age of Onset   Hypertension Mother    Diabetes Mother    Hypertension Father    Hyperlipidemia Father    Other Father        Cerebral Ataxia   Breast cancer Maternal Grandmother    Bone cancer Maternal Grandmother    Stroke Maternal Grandfather    Seizures Maternal Grandfather    Cancer Paternal Grandmother        Brain Tumor   Diabetes Paternal Grandfather    Prostate cancer Paternal Grandfather    Heart attack Paternal Grandfather    Allergies  Allergen Reactions   Macrobid [Nitrofurantoin Monohyd Macro]    Sulfa Antibiotics    Enalapril Cough     Patient Care Team: Melida Quitter, PA as PCP - General (Family Medicine)   Outpatient Medications Prior to Visit  Medication Sig   albuterol (VENTOLIN HFA) 108 (90 Base) MCG/ACT inhaler Inhale 2 puffs into the lungs  every 6 (six) hours as needed for wheezing or shortness of breath.   ALPRAZolam (XANAX) 0.5 MG tablet Take 1 tablet  by mouth 2  times daily as needed for anxiety. For MRI scan   buPROPion (WELLBUTRIN XL) 150 MG 24 hr tablet Take 1 tablet (150 mg total) by mouth daily.   Cholecalciferol (VITAMIN D) 50 MCG (2000 UT) CAPS Take by mouth.   cyclobenzaprine (FLEXERIL) 5 MG tablet Take 2 tablets by mouth 3 times daily as needed for muscle spasms.   levonorgestrel-ethinyl estradiol (SETLAKIN) 0.15-0.03 MG tablet Take 1 tablet by mouth daily.   meloxicam (MOBIC) 15 MG tablet Take 1 tablet (15 mg total) by mouth daily.   ondansetron (ZOFRAN) 4 MG tablet Take 1 tablet (4 mg total) by mouth every 8 (eight) hours as needed for nausea or vomiting.   telmisartan (MICARDIS) 20 MG tablet Take 1 tablet (20 mg total) by mouth daily.   tiZANidine (ZANAFLEX) 4 MG tablet Take 1 tablet (4 mg total) by mouth every 6 (six) hours as needed for muscle spasms.   [DISCONTINUED] escitalopram  (LEXAPRO) 5 MG tablet Take 1 tablet (5 mg total) by mouth daily.   [DISCONTINUED] rizatriptan (MAXALT) 5 MG tablet Take 1 tablet  by mouth as needed for migraine. May repeat in 2 hours if needed   No facility-administered medications prior to visit.    Review of Systems  Constitutional:  Negative for chills, fever and malaise/fatigue.  HENT:  Positive for congestion. Negative for hearing loss.   Eyes:  Negative for blurred vision and double vision.  Respiratory:  Negative for cough and shortness of breath.   Cardiovascular:  Negative for chest pain, palpitations and leg swelling.  Gastrointestinal:  Negative for abdominal pain, constipation, diarrhea and heartburn.  Genitourinary:  Negative for frequency and urgency.  Musculoskeletal:  Negative for myalgias and neck pain.  Neurological:  Negative for headaches.  Endo/Heme/Allergies:  Negative for polydipsia.  Psychiatric/Behavioral:  Negative for depression. The patient is not nervous/anxious.       Objective:    BP 132/82   Pulse 86   Temp 97.6 F (36.4 C) (Temporal)   Ht 5' 1.5" (1.562 m)   Wt 174 lb (78.9 kg)   SpO2 97%   BMI 32.34 kg/m    Physical Exam Constitutional:      General: She is not in acute distress.    Appearance: Normal appearance.  HENT:     Head: Normocephalic and atraumatic.     Right Ear: Tympanic membrane, ear canal and external ear normal.     Left Ear: Tympanic membrane, ear canal and external ear normal.     Nose: Nose normal.     Mouth/Throat:     Mouth: Mucous membranes are moist.     Pharynx: No oropharyngeal exudate or posterior oropharyngeal erythema.  Eyes:     Extraocular Movements: Extraocular movements intact.     Conjunctiva/sclera: Conjunctivae normal.     Pupils: Pupils are equal, round, and reactive to light.  Neck:     Thyroid: No thyroid mass, thyromegaly or thyroid tenderness.  Cardiovascular:     Rate and Rhythm: Normal rate and regular rhythm.     Heart sounds: Normal  heart sounds. No murmur heard.    No friction rub. No gallop.  Pulmonary:     Effort: Pulmonary effort is normal. No respiratory distress.     Breath sounds: Normal breath sounds. No wheezing, rhonchi or rales.  Abdominal:     General:  Abdomen is flat. Bowel sounds are normal. There is no distension.     Palpations: There is no mass.     Tenderness: There is no abdominal tenderness. There is no guarding.  Musculoskeletal:        General: Normal range of motion.     Cervical back: Normal range of motion and neck supple.  Lymphadenopathy:     Cervical: No cervical adenopathy.  Skin:    General: Skin is warm and dry.  Neurological:     Mental Status: She is alert and oriented to person, place, and time.     Cranial Nerves: No cranial nerve deficit.     Motor: No weakness.     Deep Tendon Reflexes: Reflexes normal.  Psychiatric:        Mood and Affect: Mood normal.        Assessment & Plan:    Routine Health Maintenance and Physical Exam  Immunization History  Administered Date(s) Administered   Influenza, Seasonal, Injecte, Preservative Fre 05/26/2016, 05/16/2017, 05/17/2018, 05/30/2019   Influenza-Unspecified 05/16/2017, 05/30/2019   Tdap 04/18/2023    Health Maintenance  Topic Date Due   COVID-19 Vaccine (1 - 2024-25 season) Never done   INFLUENZA VACCINE  11/21/2023 (Originally 03/24/2023)   Cervical Cancer Screening (HPV/Pap Cotest)  10/20/2024   DTaP/Tdap/Td (2 - Td or Tdap) 04/17/2033   Hepatitis C Screening  Completed   HIV Screening  Completed   HPV VACCINES  Aged Out    Reviewed most recent labs including CBC, CMP, lipid panel, A1C, TSH, and vitamin D. All within normal limits/stable from last check. Up-to-date on all other preventative care.  Discussed health benefits of physical activity, and encouraged her to engage in regular exercise appropriate for her age and condition.  Wellness examination  Anxiety -     Escitalopram Oxalate; Take 1 tablet (5 mg  total) by mouth daily.  Dispense: 90 tablet; Refill: 1  Migraine with aura and without status migrainosus, not intractable -     Rizatriptan Benzoate; Take 1 tablet  by mouth as needed for migraine. May repeat in 2 hours if needed  Dispense: 10 tablet; Refill: 1  Refills provided to get her through the next 6 months.  Return in about 6 months (around 04/15/2024) for follow-up for HTN, mood, fasting labs 1 week before.     Melida Quitter, PA

## 2023-10-17 NOTE — Assessment & Plan Note (Signed)
 A1C 5.8, stable.  Will continue to monitor.

## 2023-10-18 ENCOUNTER — Other Ambulatory Visit: Payer: Self-pay

## 2023-10-18 ENCOUNTER — Other Ambulatory Visit (HOSPITAL_COMMUNITY): Payer: Self-pay

## 2023-11-29 ENCOUNTER — Other Ambulatory Visit: Payer: Self-pay | Admitting: Family Medicine

## 2023-11-29 DIAGNOSIS — Z1231 Encounter for screening mammogram for malignant neoplasm of breast: Secondary | ICD-10-CM

## 2023-12-12 ENCOUNTER — Other Ambulatory Visit: Payer: Self-pay | Admitting: Physical Medicine and Rehabilitation

## 2023-12-12 ENCOUNTER — Other Ambulatory Visit (HOSPITAL_COMMUNITY): Payer: Self-pay

## 2023-12-12 ENCOUNTER — Ambulatory Visit
Admission: RE | Admit: 2023-12-12 | Discharge: 2023-12-12 | Disposition: A | Discharge status: Home / Self Care | Source: Ambulatory Visit

## 2023-12-12 DIAGNOSIS — Z1231 Encounter for screening mammogram for malignant neoplasm of breast: Secondary | ICD-10-CM

## 2023-12-12 MED ORDER — PREDNISONE 50 MG PO TABS
50.0000 mg | ORAL_TABLET | Freq: Every day | ORAL | 0 refills | Status: DC
  Filled 2023-12-12: qty 5, 5d supply, fill #0

## 2024-02-15 ENCOUNTER — Other Ambulatory Visit: Payer: Self-pay | Admitting: Physical Medicine and Rehabilitation

## 2024-02-15 ENCOUNTER — Other Ambulatory Visit (HOSPITAL_COMMUNITY): Payer: Self-pay

## 2024-02-15 ENCOUNTER — Other Ambulatory Visit: Payer: Self-pay

## 2024-02-15 MED ORDER — TIZANIDINE HCL 4 MG PO TABS
4.0000 mg | ORAL_TABLET | Freq: Four times a day (QID) | ORAL | 0 refills | Status: AC | PRN
  Filled 2024-02-15: qty 30, 8d supply, fill #0

## 2024-04-06 ENCOUNTER — Other Ambulatory Visit: Payer: Self-pay

## 2024-04-06 ENCOUNTER — Ambulatory Visit

## 2024-04-06 ENCOUNTER — Other Ambulatory Visit (HOSPITAL_COMMUNITY): Payer: Self-pay

## 2024-04-06 VITALS — BP 144/81 | HR 81 | Temp 98.0°F | Ht 61.5 in | Wt 191.0 lb

## 2024-04-06 DIAGNOSIS — F419 Anxiety disorder, unspecified: Secondary | ICD-10-CM | POA: Diagnosis not present

## 2024-04-06 DIAGNOSIS — R002 Palpitations: Secondary | ICD-10-CM | POA: Insufficient documentation

## 2024-04-06 DIAGNOSIS — Z6835 Body mass index (BMI) 35.0-35.9, adult: Secondary | ICD-10-CM | POA: Diagnosis not present

## 2024-04-06 DIAGNOSIS — E66811 Obesity, class 1: Secondary | ICD-10-CM

## 2024-04-06 DIAGNOSIS — Z130 Encounter for screening for diseases of the blood and blood-forming organs and certain disorders involving the immune mechanism: Secondary | ICD-10-CM

## 2024-04-06 DIAGNOSIS — N951 Menopausal and female climacteric states: Secondary | ICD-10-CM

## 2024-04-06 DIAGNOSIS — Z6832 Body mass index (BMI) 32.0-32.9, adult: Secondary | ICD-10-CM

## 2024-04-06 DIAGNOSIS — G43109 Migraine with aura, not intractable, without status migrainosus: Secondary | ICD-10-CM | POA: Diagnosis not present

## 2024-04-06 DIAGNOSIS — I10 Essential (primary) hypertension: Secondary | ICD-10-CM

## 2024-04-06 DIAGNOSIS — R61 Generalized hyperhidrosis: Secondary | ICD-10-CM

## 2024-04-06 DIAGNOSIS — Z3041 Encounter for surveillance of contraceptive pills: Secondary | ICD-10-CM | POA: Diagnosis not present

## 2024-04-06 DIAGNOSIS — Z13228 Encounter for screening for other metabolic disorders: Secondary | ICD-10-CM | POA: Diagnosis not present

## 2024-04-06 DIAGNOSIS — Z1321 Encounter for screening for nutritional disorder: Secondary | ICD-10-CM | POA: Diagnosis not present

## 2024-04-06 DIAGNOSIS — Z1329 Encounter for screening for other suspected endocrine disorder: Secondary | ICD-10-CM | POA: Diagnosis not present

## 2024-04-06 MED ORDER — ALPRAZOLAM 0.5 MG PO TABS
0.5000 mg | ORAL_TABLET | Freq: Two times a day (BID) | ORAL | 0 refills | Status: AC | PRN
Start: 2024-04-06 — End: ?
  Filled 2024-04-06: qty 10, 5d supply, fill #0

## 2024-04-06 MED ORDER — BUPROPION HCL ER (XL) 300 MG PO TB24
300.0000 mg | ORAL_TABLET | Freq: Every day | ORAL | 2 refills | Status: AC
  Filled 2024-04-06: qty 60, 60d supply, fill #0
  Filled 2024-06-20: qty 60, 60d supply, fill #1
  Filled 2024-08-24: qty 60, 60d supply, fill #2

## 2024-04-06 MED ORDER — LEVONORGEST-ETH ESTRAD 91-DAY 0.15-0.03 MG PO TABS
1.0000 | ORAL_TABLET | Freq: Every day | ORAL | 3 refills | Status: AC
  Filled 2024-04-06 – 2024-06-08 (×2): qty 91, 91d supply, fill #0
  Filled 2024-09-04: qty 91, 91d supply, fill #1

## 2024-04-06 NOTE — Assessment & Plan Note (Signed)
 Occasional palpitations likely PVCs. EKGs from apple watch show normal sinus rhythm. - Consider propranolol if palpitations worsen. - Monitor symptoms; consider cardiology referral if symptoms worsen.

## 2024-04-06 NOTE — Progress Notes (Signed)
 Established Patient Office Visit  Subjective   Patient ID: Andrea Love, female    DOB: 1981-07-26  Age: 43 y.o. MRN: [id number]  Chief Complaint  Patient presents with   Medical Management of Chronic Issues    HPI  History of Present Illness   Andrea Love is a 43 year old female who presents for follow-up on weight loss medication and concerns about menstrual irregularities.  Weight management and pharmacotherapy - Currently taking Wellbutrin  150 mg XL for anxiety and weight loss - Previously trialed phentermine but did not tolerate it well  Menstrual irregularities and associated symptoms - On oral contraceptive pills since early twenties, except during pregnancy and breastfeeding - Recent menstrual cycle was notably lighter with only brief spotting - Increased sweating, occurring daily and involving face and body, even after showering  Headache and migraine symptoms - History of migraines, uses rizatriptan  as needed (typically three doses in a 90-day period) - Uses Advil and Tylenol  for milder headaches - History of partially empty sella on MRI, discovered during headache workup years ago  Blood pressure management - Currently taking telmisartan  20 mg, using half a tablet due to good blood pressure control - Blood pressure readings at work range from 129/79 to 132/82 - Monitors blood pressure twice a week  Palpitations and cardiovascular concerns - Occasional heart palpitations described as heart 'skipping a beat', occurring randomly without specific triggers - Denies CP, SOB, HA, vision changes, edema, or weakness.  -Tracks EKG on her apple watch without hx of abnormal EKG         ROS Per HPI.    Objective:     BP (!) 144/81   Pulse 81   Temp 98 F (36.7 C) (Oral)   Ht 5' 1.5 (1.562 m)   Wt 191 lb (86.6 kg)   LMP 03/31/2024   SpO2 97%   BMI 35.50 kg/m    Physical Exam Constitutional:      General: She is not in acute distress.     Appearance: Normal appearance.  Cardiovascular:     Rate and Rhythm: Normal rate and regular rhythm.     Heart sounds: Normal heart sounds. No murmur heard.    No friction rub. No gallop.  Pulmonary:     Effort: Pulmonary effort is normal. No respiratory distress.     Breath sounds: Normal breath sounds.  Musculoskeletal:        General: No swelling.  Skin:    General: Skin is warm and dry.  Neurological:     General: No focal deficit present.     Mental Status: She is alert.  Psychiatric:        Mood and Affect: Mood normal.        Behavior: Behavior normal.        Thought Content: Thought content normal.     No results found for any visits on 04/06/24.  Last CBC Lab Results  Component Value Date   WBC 8.0 10/14/2023   HGB 13.6 10/14/2023   HCT 41.5 10/14/2023   MCV 89 10/14/2023   MCH 29.3 10/14/2023   RDW 12.8 10/14/2023   PLT 384 10/14/2023   Last metabolic panel Lab Results  Component Value Date   GLUCOSE 78 10/14/2023   NA 140 10/14/2023   K 4.4 10/14/2023   CL 106 10/14/2023   CO2 20 10/14/2023   BUN 14 10/14/2023   CREATININE 0.79 10/14/2023   EGFR 96 10/14/2023   CALCIUM 8.7 10/14/2023  PROT 7.0 10/14/2023   ALBUMIN 4.2 10/14/2023   LABGLOB 2.8 10/14/2023   AGRATIO 1.3 10/13/2022   BILITOT <0.2 10/14/2023   ALKPHOS 70 10/14/2023   AST 14 10/14/2023   ALT 12 10/14/2023   ANIONGAP 11 12/19/2022   Last lipids Lab Results  Component Value Date   CHOL 145 10/14/2023   HDL 39 (L) 10/14/2023   LDLCALC 90 10/14/2023   TRIG 83 10/14/2023   CHOLHDL 3.7 10/14/2023   Last hemoglobin A1c Lab Results  Component Value Date   HGBA1C 5.5 10/14/2023   Last thyroid  functions Lab Results  Component Value Date   TSH 1.510 10/14/2023   Last vitamin D  Lab Results  Component Value Date   VD25OH 61.7 10/14/2023      The 10-year ASCVD risk score (Arnett DK, et al., 2019) is: 1.1%    Assessment & Plan:   Perimenopause -     VITAMIN D  25 Hydroxy  (Vit-D Deficiency, Fractures) -     Estradiol  -     Progesterone  -     FSH/LH  Anxiety Assessment & Plan: Mood and anxiety well-managed. Wellbutrin  used for mood stabilization and weight management. Lexapro  discontinued due to weight gain. - Increase Wellbutrin  to 300 mg XL daily. - Monitor mood changes with increased Wellbutrin . - Refill of Alprazolam  0.5 mg for #10 pills with 0 refills to use as needed for breakthrough anxiety. PDMP reviewed, no aberrancies.  Orders: -     ALPRAZolam ; Take 1 tablet  by mouth 2  times daily as needed for anxiety. For MRI scan  Dispense: 10 tablet; Refill: 0  Encounter for birth control pills maintenance -     Levonorgest-Eth Estrad 91-Day; Take 1 tablet by mouth daily.  Dispense: 91 tablet; Refill: 3  Class 1 obesity without serious comorbidity with body mass index (BMI) of 32.0 to 32.9 in adult, unspecified obesity type -     TSH -     Hemoglobin A1c -     Lipid panel -     Comprehensive metabolic panel with GFR -     CBC with Differential/Platelet  Screening for endocrine, nutritional, metabolic and immunity disorder -     VITAMIN D  25 Hydroxy (Vit-D Deficiency, Fractures)  BMI 35.0-35.9,adult Assessment & Plan: Wellbutrin  used for anxiety and weight management. Phentermine not tolerated. Topiramate considered for weight loss and migraine prophylaxis. - Increase Wellbutrin  to 300 mg XL daily. - Consider topiramate if Wellbutrin  tolerated; report tolerance ivia MyChart in 2-3 weeks.  Current weight: 191    Essential (primary) hypertension Assessment & Plan: BP goal <140/90. Blood pressure well-controlled with telmisartan . Monitors regularly at work. - Continue telmisartan  20 mg at half a tablet daily (10 mg) - Monitor blood pressure; increase to full tablet if above goal consistently.   Hyperhidrosis Assessment & Plan: Symptoms consistent with perimenopausal changes. - Check hormone levels today (see orders) - Continue oral birth  control. - Pending hormone labs, consider Effexor vs Propanolol   Migraine with aura and without status migrainosus, not intractable Assessment & Plan: Migraines managed with rizatriptan . Frequency increased but managed with OTC medications. - Continue rizatriptan  for acute management. - Consider topiramate vs propanolol for prophylaxis in the future.   Palpitations Assessment & Plan: Occasional palpitations likely PVCs. EKGs from apple watch show normal sinus rhythm. - Consider propranolol if palpitations worsen. - Monitor symptoms; consider cardiology referral if symptoms worsen.   Other orders -     buPROPion  HCl ER (XL); Take 1 tablet (300  mg total) by mouth daily.  Dispense: 60 tablet; Refill: 2       Return in about 6 months (around 10/07/2024) for Physical.    Saddie JULIANNA Sacks, PA-C

## 2024-04-06 NOTE — Patient Instructions (Addendum)
 VISIT SUMMARY: Today, we discussed your ongoing weight management, menstrual irregularities, headaches, blood pressure, and occasional heart palpitations. We reviewed your current medications and made some adjustments to better manage your symptoms and overall health.  YOUR PLAN: -OBESITY AND WEIGHT MANAGEMENT: Your weight gain may be linked to Lexapro , which we have decided to discontinue. We will increase your Wellbutrin  to 300 mg daily to help with both anxiety and weight management. If you tolerate Wellbutrin  well, we may consider adding topiramate for additional weight loss and migraine prevention. Please report back in 2-3 weeks on how you are tolerating the increased dose.  -GENERALIZED ANXIETY DISORDER AND DEPRESSION: Your mood and anxiety are currently well-managed with Wellbutrin , which we are increasing to 300 mg daily. We will monitor for any mood changes with this increased dose.  -PERIMENOPAUSAL SYMPTOMS: Your symptoms are consistent with perimenopausal changes. We will check your hormone levels and continue your current oral birth control. We may consider propranolol to help with sweating and palpitations; we will review these options together. The other options for increased sweating include Effexor and Paxil. Please read about these and let me know if this is something you would like to try.  -MIGRAINE: Your migraines are currently managed with rizatriptan . We will continue this medication for acute management. If you tolerate the increased Wellbutrin , we may consider adding topiramate for migraine prevention.  -BENIGN PALPITATIONS (LIKELY PVCS): Your occasional heart palpitations are likely premature ventricular contractions (PVCs), which are generally harmless. Your EKGs show a normal heart rhythm. If your palpitations worsen, we may consider propranolol. We will monitor your symptoms and consider a cardiology referral if they worsen.  -ESSENTIAL HYPERTENSION: Your blood pressure is  well-controlled with telmisartan . Continue taking half a tablet daily and monitor your blood pressure regularly. If your readings go above 130/80 mmHg, we may need to increase the dose to a full tablet.  INSTRUCTIONS: Please follow up in 2-3 weeks to report how you are tolerating the increased dose of Wellbutrin . Continue monitoring your blood pressure twice a week and keep track of any changes in your symptoms. If you experience worsening palpitations or other concerning symptoms, please contact us  immediately.  If you have any problems before your next visit feel free to message me via MyChart (minor issues or questions) or call the office, otherwise you may reach out to schedule an office visit.  Thank you! Saddie Sacks, PA-C

## 2024-04-06 NOTE — Assessment & Plan Note (Addendum)
 BP goal <140/90. Blood pressure well-controlled with telmisartan . Monitors regularly at work. - Continue telmisartan  20 mg at half a tablet daily (10 mg) - Monitor blood pressure; increase to full tablet if above goal consistently.

## 2024-04-06 NOTE — Assessment & Plan Note (Signed)
 Wellbutrin  used for anxiety and weight management. Phentermine not tolerated. Topiramate considered for weight loss and migraine prophylaxis. - Increase Wellbutrin  to 300 mg XL daily. - Consider topiramate if Wellbutrin  tolerated; report tolerance ivia MyChart in 2-3 weeks.  Current weight: 191

## 2024-04-06 NOTE — Assessment & Plan Note (Signed)
 Symptoms consistent with perimenopausal changes. - Check hormone levels today (see orders) - Continue oral birth control. - Pending hormone labs, consider Effexor vs Propanolol

## 2024-04-06 NOTE — Assessment & Plan Note (Signed)
 Migraines managed with rizatriptan . Frequency increased but managed with OTC medications. - Continue rizatriptan  for acute management. - Consider topiramate vs propanolol for prophylaxis in the future.

## 2024-04-06 NOTE — Assessment & Plan Note (Signed)
 Mood and anxiety well-managed. Wellbutrin  used for mood stabilization and weight management. Lexapro  discontinued due to weight gain. - Increase Wellbutrin  to 300 mg XL daily. - Monitor mood changes with increased Wellbutrin . - Refill of Alprazolam  0.5 mg for #10 pills with 0 refills to use as needed for breakthrough anxiety. PDMP reviewed, no aberrancies.

## 2024-04-07 LAB — ESTRADIOL: Estradiol: 5.2 pg/mL

## 2024-04-07 LAB — CBC WITH DIFFERENTIAL/PLATELET
Basophils Absolute: 0.1 x10E3/uL (ref 0.0–0.2)
Basos: 1 %
EOS (ABSOLUTE): 0.1 x10E3/uL (ref 0.0–0.4)
Eos: 1 %
Hematocrit: 45.1 % (ref 34.0–46.6)
Hemoglobin: 14.2 g/dL (ref 11.1–15.9)
Immature Grans (Abs): 0 x10E3/uL (ref 0.0–0.1)
Immature Granulocytes: 0 %
Lymphocytes Absolute: 2 x10E3/uL (ref 0.7–3.1)
Lymphs: 15 %
MCH: 29.4 pg (ref 26.6–33.0)
MCHC: 31.5 g/dL (ref 31.5–35.7)
MCV: 93 fL (ref 79–97)
Monocytes Absolute: 0.7 x10E3/uL (ref 0.1–0.9)
Monocytes: 5 %
Neutrophils Absolute: 10.7 x10E3/uL — ABNORMAL HIGH (ref 1.4–7.0)
Neutrophils: 78 %
Platelets: 419 x10E3/uL (ref 150–450)
RBC: 4.83 x10E6/uL (ref 3.77–5.28)
RDW: 13.2 % (ref 11.7–15.4)
WBC: 13.6 x10E3/uL — ABNORMAL HIGH (ref 3.4–10.8)

## 2024-04-07 LAB — COMPREHENSIVE METABOLIC PANEL WITH GFR
ALT: 20 IU/L (ref 0–32)
AST: 20 IU/L (ref 0–40)
Albumin: 4.3 g/dL (ref 3.9–4.9)
Alkaline Phosphatase: 93 IU/L (ref 44–121)
BUN/Creatinine Ratio: 16 (ref 9–23)
BUN: 14 mg/dL (ref 6–24)
Bilirubin Total: 0.3 mg/dL (ref 0.0–1.2)
CO2: 21 mmol/L (ref 20–29)
Calcium: 9.2 mg/dL (ref 8.7–10.2)
Chloride: 102 mmol/L (ref 96–106)
Creatinine, Ser: 0.88 mg/dL (ref 0.57–1.00)
Globulin, Total: 3.1 g/dL (ref 1.5–4.5)
Glucose: 71 mg/dL (ref 70–99)
Potassium: 4.4 mmol/L (ref 3.5–5.2)
Sodium: 140 mmol/L (ref 134–144)
Total Protein: 7.4 g/dL (ref 6.0–8.5)
eGFR: 85 mL/min/1.73 (ref >59)

## 2024-04-07 LAB — LIPID PANEL
Chol/HDL Ratio: 2.8 ratio (ref 0.0–4.4)
Cholesterol, Total: 165 mg/dL (ref 100–199)
HDL: 59 mg/dL (ref >39)
LDL Chol Calc (NIH): 90 mg/dL (ref 0–99)
Triglycerides: 86 mg/dL (ref 0–149)
VLDL Cholesterol Cal: 16 mg/dL (ref 5–40)

## 2024-04-07 LAB — PROGESTERONE: Progesterone: 0.1 ng/mL

## 2024-04-07 LAB — HEMOGLOBIN A1C
Est. average glucose Bld gHb Est-mCnc: 114 mg/dL
Hgb A1c MFr Bld: 5.6 % (ref 4.8–5.6)

## 2024-04-07 LAB — FSH/LH
FSH: 3.1 m[IU]/mL
LH: 1.9 m[IU]/mL

## 2024-04-07 LAB — VITAMIN D 25 HYDROXY (VIT D DEFICIENCY, FRACTURES): Vit D, 25-Hydroxy: 51 ng/mL (ref 30.0–100.0)

## 2024-04-07 LAB — TSH: TSH: 1.5 u[IU]/mL (ref 0.450–4.500)

## 2024-04-09 ENCOUNTER — Ambulatory Visit: Payer: Self-pay

## 2024-05-10 ENCOUNTER — Other Ambulatory Visit (HOSPITAL_COMMUNITY): Payer: Self-pay

## 2024-05-10 ENCOUNTER — Telehealth: Payer: Self-pay

## 2024-05-10 ENCOUNTER — Other Ambulatory Visit: Payer: Self-pay

## 2024-05-10 DIAGNOSIS — R1011 Right upper quadrant pain: Secondary | ICD-10-CM

## 2024-05-10 MED ORDER — ONDANSETRON HCL 4 MG PO TABS
4.0000 mg | ORAL_TABLET | Freq: Three times a day (TID) | ORAL | 0 refills | Status: AC | PRN
  Filled 2024-05-10: qty 20, 7d supply, fill #0

## 2024-05-10 MED ORDER — KETOROLAC TROMETHAMINE 10 MG PO TABS
10.0000 mg | ORAL_TABLET | Freq: Four times a day (QID) | ORAL | 0 refills | Status: AC | PRN
  Filled 2024-05-10: qty 20, 5d supply, fill #0

## 2024-05-10 NOTE — Telephone Encounter (Signed)
 Spoke with patient and have her scheduled.

## 2024-05-10 NOTE — Telephone Encounter (Signed)
Message forward to provider.

## 2024-05-11 ENCOUNTER — Ambulatory Visit

## 2024-05-11 VITALS — BP 132/84 | HR 88 | Ht 61.5 in | Wt 192.0 lb

## 2024-05-11 DIAGNOSIS — G8929 Other chronic pain: Secondary | ICD-10-CM | POA: Diagnosis not present

## 2024-05-11 DIAGNOSIS — R1011 Right upper quadrant pain: Secondary | ICD-10-CM | POA: Insufficient documentation

## 2024-05-11 DIAGNOSIS — R319 Hematuria, unspecified: Secondary | ICD-10-CM

## 2024-05-11 DIAGNOSIS — M545 Low back pain, unspecified: Secondary | ICD-10-CM | POA: Diagnosis not present

## 2024-05-11 LAB — POCT URINALYSIS DIP (MANUAL ENTRY)
Bilirubin, UA: NEGATIVE
Glucose, UA: NEGATIVE mg/dL
Ketones, POC UA: NEGATIVE mg/dL
Leukocytes, UA: NEGATIVE
Nitrite, UA: NEGATIVE
Spec Grav, UA: 1.02 (ref 1.010–1.025)
Urobilinogen, UA: 0.2 U/dL
pH, UA: 6.5 (ref 5.0–8.0)

## 2024-05-11 LAB — POCT URINE PREGNANCY: Preg Test, Ur: NEGATIVE

## 2024-05-11 NOTE — Addendum Note (Signed)
 Addended byBETHA GAYLE NUMBERS on: 05/11/2024 08:48 AM   Modules accepted: Orders

## 2024-05-11 NOTE — Progress Notes (Signed)
 Acute Office Visit  Subjective:     Patient ID: Andrea Love, female    DOB: 1981-06-03, 43 y.o.   MRN: [id number]  Chief Complaint  Patient presents with   Abdominal Pain    Right sided    HPI  History of Present Illness   Andrea Love is a 43 year old female who presents with abdominal pain and suspected gallstones.  Abdominal pain and associated gastrointestinal symptoms - Sharp, knife-like upper abdominal pain occurring after meals, particularly with greasy foods such as sesame chicken - Pain is localized to the upper abdomen, non-radiating, and lasts approximately one minute, followed by a lingering sore sensation - Pain is accompanied by nausea and cramping sensations - Smaller, leaner meals do not exacerbate the pain - Intermittent symptoms, with greasy foods as a clear trigger - No vomiting, jaundice, or urinary symptoms - Regular bowel movements, though experienced some episodes of diarrhea last week - Nausea present even prior to the onset of current abdominal pain  Back pain - Back pain managed with muscle relaxers - Uncertain if back pain is related to current abdominal symptoms  Constitutional symptoms - Felt cold last weekend, wore warm clothes despite warm weather - No fever recalled  Travel concerns - Planning a trip to [place]  (leaves on Tuesday) and concerned about managing symptoms while traveling      ROS Per HPI     Objective:    BP 132/84   Pulse 88   Ht 5' 1.5 (1.562 m)   Wt 192 lb (87.1 kg)   LMP 03/31/2024   SpO2 98%   BMI 35.69 kg/m    Physical Exam Constitutional:      General: She is not in acute distress.    Appearance: Normal appearance.  Cardiovascular:     Rate and Rhythm: Normal rate and regular rhythm.     Heart sounds: Normal heart sounds. No murmur heard.    No friction rub. No gallop.  Pulmonary:     Effort: Pulmonary effort is normal. No respiratory distress.     Breath sounds: Normal breath sounds.   Abdominal:     Tenderness: There is abdominal tenderness in the right upper quadrant. There is no right CVA tenderness, left CVA tenderness or guarding. Negative signs include Murphy's sign.  Musculoskeletal:        General: No swelling.  Skin:    General: Skin is warm and dry.  Neurological:     General: No focal deficit present.     Mental Status: She is alert.  Psychiatric:        Mood and Affect: Mood normal.        Behavior: Behavior normal.        Thought Content: Thought content normal.      Results for orders placed or performed in visit on 05/11/24  POCT urinalysis dipstick  Result Value Ref Range   Color, UA yellow yellow   Clarity, UA clear clear   Glucose, UA negative negative mg/dL   Bilirubin, UA negative negative   Ketones, POC UA negative negative mg/dL   Spec Grav, UA 8.979 8.989 - 1.025   Blood, UA moderate (A) negative   pH, UA 6.5 5.0 - 8.0   Protein Ur, POC trace (A) negative mg/dL   Urobilinogen, UA 0.2 0.2 or 1.0 E.U./dL   Nitrite, UA Negative Negative   Leukocytes, UA Negative Negative  POCT urine pregnancy  Result Value Ref Range   Preg Test, Ur Negative  Negative        Assessment & Plan:  RUQ pain Assessment & Plan: Intermittent abdominal pain likely due to gallstones. Symptoms align with gallstones, not kidney stones. No acute cholecystitis signs. Symptoms improve with dietary changes. - Order urgent ultrasound of the gallbladder. - Prescribe antiemetic and analgesic medications. - Advise smaller, leaner meals to avoid symptom triggers. - Discuss potential cholecystectomy or medication to dissolve stones post-travel. - Instruct to seek emergency care for severe symptoms such as fever, constant vomiting, or jaundice.  Orders: -     CBC with Differential/Platelet -     Comprehensive metabolic panel with GFR -     POCT Pregnancy, Urine -     Gamma GT -     POCT urinalysis dipstick -     Urine Microscopic -     POCT urine  pregnancy  Hematuria, unspecified type -     Urine Microscopic -     POCT urine pregnancy  Chronic bilateral low back pain without sciatica Assessment & Plan: Low back pain likely musculoskeletal, possibly related to trigger points. Unlikely kidney stones due to absence of urinary symptoms, however UA today is significant for moderate blood. Will order a microscopic to ensure that this is truly blood and not bilirubin. - Manage with muscle relaxers as needed. - Consider CT if true blood present in the urine       Return if symptoms worsen or fail to improve.  Saddie JULIANNA Sacks, PA-C

## 2024-05-11 NOTE — Assessment & Plan Note (Signed)
 Intermittent abdominal pain likely due to gallstones. Symptoms align with gallstones, not kidney stones. No acute cholecystitis signs. Symptoms improve with dietary changes. - Order urgent ultrasound of the gallbladder. - Prescribe antiemetic and analgesic medications. - Advise smaller, leaner meals to avoid symptom triggers. - Discuss potential cholecystectomy or medication to dissolve stones post-travel. - Instruct to seek emergency care for severe symptoms such as fever, constant vomiting, or jaundice.

## 2024-05-11 NOTE — Patient Instructions (Signed)
 VISIT SUMMARY: Today, you were seen for abdominal pain and suspected gallstones. We discussed your symptoms, including sharp upper abdominal pain after meals, nausea, and back pain. We also talked about your upcoming trip to [place]  and how to manage your symptoms while traveling.  YOUR PLAN: -CHOLELITHIASIS (GALLSTONES): Gallstones are hard deposits that form in your gallbladder and can cause sharp abdominal pain, especially after eating greasy foods. We will order an urgent ultrasound of your gallbladder to confirm the diagnosis. In the meantime, you should eat smaller, leaner meals to avoid triggering your symptoms. I have prescribed antiemetic (to help with nausea) and analgesic (pain relief) medications. After your trip, we can discuss the possibility of surgery to remove the gallstones or medication to dissolve them. If you experience severe symptoms like fever, constant vomiting, or jaundice, seek emergency care immediately.  -NAUSEA: Your nausea is likely related to the gallstones and has been occurring even before the abdominal pain started. I have prescribed antiemetic medication to help manage this symptom.  -LOW BACK PAIN (MUSCULOSKELETAL): Your low back pain is likely due to muscle issues and is not related to kidney stones, as you do not have urinary symptoms. Continue to use muscle relaxers as needed. We will also collect a urine sample to rule out kidney stones.  INSTRUCTIONS: Please schedule an urgent ultrasound of your gallbladder. Continue to eat smaller, leaner meals to avoid triggering your symptoms. Take the prescribed antiemetic and analgesic medications as directed. If you experience severe symptoms such as fever, constant vomiting, or jaundice, seek emergency care immediately. We will discuss further treatment options, including possible surgery, after your trip. Additionally, provide a urine sample to rule out kidney stones.  If you have any problems before your next visit  feel free to message me via MyChart (minor issues or questions) or call the office, otherwise you may reach out to schedule an office visit.  Thank you! Saddie Sacks, PA-C

## 2024-05-11 NOTE — Assessment & Plan Note (Signed)
 Low back pain likely musculoskeletal, possibly related to trigger points. Unlikely kidney stones due to absence of urinary symptoms, however UA today is significant for moderate blood. Will order a microscopic to ensure that this is truly blood and not bilirubin. - Manage with muscle relaxers as needed. - Consider CT if true blood present in the urine

## 2024-05-12 LAB — COMPREHENSIVE METABOLIC PANEL WITH GFR
ALT: 21 IU/L (ref 0–32)
AST: 18 IU/L (ref 0–40)
Albumin: 4.2 g/dL (ref 3.9–4.9)
Alkaline Phosphatase: 79 IU/L (ref 41–116)
BUN/Creatinine Ratio: 13 (ref 9–23)
BUN: 11 mg/dL (ref 6–24)
Bilirubin Total: 0.2 mg/dL (ref 0.0–1.2)
CO2: 22 mmol/L (ref 20–29)
Calcium: 8.9 mg/dL (ref 8.7–10.2)
Chloride: 104 mmol/L (ref 96–106)
Creatinine, Ser: 0.85 mg/dL (ref 0.57–1.00)
Globulin, Total: 2.9 g/dL (ref 1.5–4.5)
Glucose: 66 mg/dL — ABNORMAL LOW (ref 70–99)
Potassium: 4.4 mmol/L (ref 3.5–5.2)
Sodium: 140 mmol/L (ref 134–144)
Total Protein: 7.1 g/dL (ref 6.0–8.5)
eGFR: 88 mL/min/1.73 (ref >59)

## 2024-05-12 LAB — CBC WITH DIFFERENTIAL/PLATELET
Basophils Absolute: 0.1 x10E3/uL (ref 0.0–0.2)
Basos: 1 %
EOS (ABSOLUTE): 0.2 x10E3/uL (ref 0.0–0.4)
Eos: 1 %
Hematocrit: 45.5 % (ref 34.0–46.6)
Hemoglobin: 14.1 g/dL (ref 11.1–15.9)
Immature Grans (Abs): 0.1 x10E3/uL (ref 0.0–0.1)
Immature Granulocytes: 1 %
Lymphocytes Absolute: 1.9 x10E3/uL (ref 0.7–3.1)
Lymphs: 13 %
MCH: 28.4 pg (ref 26.6–33.0)
MCHC: 31 g/dL — ABNORMAL LOW (ref 31.5–35.7)
MCV: 92 fL (ref 79–97)
Monocytes Absolute: 0.7 x10E3/uL (ref 0.1–0.9)
Monocytes: 5 %
Neutrophils Absolute: 11.6 x10E3/uL — ABNORMAL HIGH (ref 1.4–7.0)
Neutrophils: 79 %
Platelets: 477 x10E3/uL — ABNORMAL HIGH (ref 150–450)
RBC: 4.96 x10E6/uL (ref 3.77–5.28)
RDW: 13.2 % (ref 11.7–15.4)
WBC: 14.5 x10E3/uL — ABNORMAL HIGH (ref 3.4–10.8)

## 2024-05-12 LAB — SPECIMEN STATUS REPORT

## 2024-05-12 LAB — GAMMA GT: GGT: 23 IU/L (ref 0–60)

## 2024-05-12 LAB — URINALYSIS, MICROSCOPIC ONLY

## 2024-05-13 ENCOUNTER — Ambulatory Visit: Payer: Self-pay

## 2024-05-14 ENCOUNTER — Ambulatory Visit: Payer: Self-pay

## 2024-05-14 ENCOUNTER — Ambulatory Visit
Admission: RE | Admit: 2024-05-14 | Discharge: 2024-05-14 | Disposition: A | Discharge status: Home / Self Care | Source: Ambulatory Visit

## 2024-05-14 DIAGNOSIS — R1011 Right upper quadrant pain: Secondary | ICD-10-CM

## 2024-05-14 NOTE — Telephone Encounter (Signed)
 Spoke with patient. She's going to drop of specimen tomorrow during our lunch. Patient is scheduled to be going out of town.

## 2024-05-15 ENCOUNTER — Other Ambulatory Visit (INDEPENDENT_AMBULATORY_CARE_PROVIDER_SITE_OTHER)

## 2024-05-15 ENCOUNTER — Other Ambulatory Visit: Payer: Self-pay

## 2024-05-15 DIAGNOSIS — R319 Hematuria, unspecified: Secondary | ICD-10-CM | POA: Diagnosis not present

## 2024-05-15 LAB — POCT URINALYSIS DIPSTICK
Bilirubin, UA: NEGATIVE
Glucose, UA: NEGATIVE
Ketones, UA: NEGATIVE
Leukocytes, UA: NEGATIVE
Nitrite, UA: NEGATIVE
Protein, UA: POSITIVE — AB
Spec Grav, UA: >=1.03 — AB (ref 1.010–1.025)
Urobilinogen, UA: 0.2 U/dL
pH, UA: 6.5 (ref 5.0–8.0)

## 2024-05-16 LAB — URINALYSIS, ROUTINE W REFLEX MICROSCOPIC

## 2024-05-22 ENCOUNTER — Other Ambulatory Visit: Payer: Self-pay

## 2024-05-22 ENCOUNTER — Other Ambulatory Visit

## 2024-05-22 DIAGNOSIS — R319 Hematuria, unspecified: Secondary | ICD-10-CM | POA: Diagnosis not present

## 2024-05-22 DIAGNOSIS — R1011 Right upper quadrant pain: Secondary | ICD-10-CM | POA: Diagnosis not present

## 2024-05-22 DIAGNOSIS — D72829 Elevated white blood cell count, unspecified: Secondary | ICD-10-CM

## 2024-05-22 LAB — URINALYSIS, ROUTINE W REFLEX MICROSCOPIC

## 2024-05-22 LAB — SPECIMEN STATUS REPORT

## 2024-05-23 ENCOUNTER — Ambulatory Visit: Payer: Self-pay

## 2024-05-23 LAB — CBC WITH DIFFERENTIAL/PLATELET
Basophils Absolute: 0.1 x10E3/uL (ref 0.0–0.2)
Basos: 1 %
EOS (ABSOLUTE): 0.2 x10E3/uL (ref 0.0–0.4)
Eos: 2 %
Hematocrit: 41.8 % (ref 34.0–46.6)
Hemoglobin: 13.5 g/dL (ref 11.1–15.9)
Immature Grans (Abs): 0 x10E3/uL (ref 0.0–0.1)
Immature Granulocytes: 0 %
Lymphocytes Absolute: 1.8 x10E3/uL (ref 0.7–3.1)
Lymphs: 17 %
MCH: 28.8 pg (ref 26.6–33.0)
MCHC: 32.3 g/dL (ref 31.5–35.7)
MCV: 89 fL (ref 79–97)
Monocytes Absolute: 0.7 x10E3/uL (ref 0.1–0.9)
Monocytes: 7 %
Neutrophils Absolute: 7.7 x10E3/uL — ABNORMAL HIGH (ref 1.4–7.0)
Neutrophils: 73 %
Platelets: 436 x10E3/uL (ref 150–450)
RBC: 4.68 x10E6/uL (ref 3.77–5.28)
RDW: 13.2 % (ref 11.7–15.4)
WBC: 10.5 x10E3/uL (ref 3.4–10.8)

## 2024-05-23 LAB — URINALYSIS, MICROSCOPIC ONLY
Bacteria, UA: NONE SEEN
Casts: NONE SEEN /LPF

## 2024-05-23 LAB — SPECIMEN STATUS REPORT

## 2024-05-23 LAB — B12 AND FOLATE PANEL
Folate: 9.8 ng/mL (ref >3.0)
Vitamin B-12: 265 pg/mL (ref 232–1245)

## 2024-05-23 LAB — IRON,TIBC AND FERRITIN PANEL
Ferritin: 100 ng/mL (ref 15–150)
Iron Saturation: 17 % (ref 15–55)
Iron: 74 ug/dL (ref 27–159)
Total Iron Binding Capacity: 440 ug/dL (ref 250–450)
UIBC: 366 ug/dL (ref 131–425)

## 2024-05-23 LAB — SEDIMENTATION RATE: Sed Rate: 59 mm/h — ABNORMAL HIGH (ref 0–32)

## 2024-05-23 LAB — C-REACTIVE PROTEIN: CRP: 14 mg/L — ABNORMAL HIGH (ref 0–10)

## 2024-05-24 ENCOUNTER — Other Ambulatory Visit: Payer: Self-pay

## 2024-05-24 DIAGNOSIS — R1011 Right upper quadrant pain: Secondary | ICD-10-CM

## 2024-06-01 ENCOUNTER — Ambulatory Visit
Admission: RE | Admit: 2024-06-01 | Discharge: 2024-06-01 | Disposition: A | Discharge status: Home / Self Care | Source: Ambulatory Visit

## 2024-06-01 DIAGNOSIS — R1011 Right upper quadrant pain: Secondary | ICD-10-CM

## 2024-06-01 DIAGNOSIS — D1809 Hemangioma of other sites: Secondary | ICD-10-CM | POA: Diagnosis not present

## 2024-06-01 MED ORDER — IOPAMIDOL (ISOVUE-300) INJECTION 61%
80.0000 mL | Freq: Once | INTRAVENOUS | Status: AC | PRN
  Administered 2024-06-01: 80 mL via INTRAVENOUS

## 2024-06-06 ENCOUNTER — Ambulatory Visit: Payer: Self-pay

## 2024-06-06 DIAGNOSIS — R319 Hematuria, unspecified: Secondary | ICD-10-CM

## 2024-06-06 DIAGNOSIS — R1011 Right upper quadrant pain: Secondary | ICD-10-CM

## 2024-06-06 DIAGNOSIS — D72829 Elevated white blood cell count, unspecified: Secondary | ICD-10-CM

## 2024-06-06 NOTE — Telephone Encounter (Signed)
 Called and have patient scheduled for

## 2024-06-08 ENCOUNTER — Other Ambulatory Visit: Payer: Self-pay | Admitting: Family Medicine

## 2024-06-08 ENCOUNTER — Other Ambulatory Visit: Payer: Self-pay

## 2024-06-08 ENCOUNTER — Other Ambulatory Visit (HOSPITAL_COMMUNITY): Payer: Self-pay

## 2024-06-08 DIAGNOSIS — I10 Essential (primary) hypertension: Secondary | ICD-10-CM

## 2024-06-11 ENCOUNTER — Other Ambulatory Visit (HOSPITAL_COMMUNITY): Payer: Self-pay

## 2024-06-11 MED ORDER — TELMISARTAN 20 MG PO TABS
20.0000 mg | ORAL_TABLET | Freq: Every day | ORAL | 3 refills | Status: AC
  Filled 2024-06-11: qty 90, 90d supply, fill #0
  Filled 2024-09-04: qty 90, 90d supply, fill #1

## 2024-06-13 ENCOUNTER — Other Ambulatory Visit

## 2024-06-14 ENCOUNTER — Other Ambulatory Visit

## 2024-06-14 DIAGNOSIS — D72829 Elevated white blood cell count, unspecified: Secondary | ICD-10-CM | POA: Diagnosis not present

## 2024-06-14 DIAGNOSIS — R1011 Right upper quadrant pain: Secondary | ICD-10-CM | POA: Diagnosis not present

## 2024-06-14 DIAGNOSIS — R319 Hematuria, unspecified: Secondary | ICD-10-CM | POA: Diagnosis not present

## 2024-06-15 LAB — URINALYSIS
Bilirubin, UA: NEGATIVE
Glucose, UA: NEGATIVE
Ketones, UA: NEGATIVE
Nitrite, UA: NEGATIVE
Specific Gravity, UA: 1.027 (ref 1.005–1.030)
Urobilinogen, Ur: 0.2 mg/dL (ref 0.2–1.0)
pH, UA: 5.5 (ref 5.0–7.5)

## 2024-06-15 LAB — COMPREHENSIVE METABOLIC PANEL WITH GFR
ALT: 16 IU/L (ref 0–32)
AST: 17 IU/L (ref 0–40)
Albumin: 4.3 g/dL (ref 3.9–4.9)
Alkaline Phosphatase: 84 IU/L (ref 41–116)
BUN/Creatinine Ratio: 22 (ref 9–23)
BUN: 17 mg/dL (ref 6–24)
Bilirubin Total: 0.2 mg/dL (ref 0.0–1.2)
CO2: 20 mmol/L (ref 20–29)
Calcium: 8.7 mg/dL (ref 8.7–10.2)
Chloride: 105 mmol/L (ref 96–106)
Creatinine, Ser: 0.79 mg/dL (ref 0.57–1.00)
Globulin, Total: 2.9 g/dL (ref 1.5–4.5)
Glucose: 76 mg/dL (ref 70–99)
Potassium: 4.4 mmol/L (ref 3.5–5.2)
Sodium: 139 mmol/L (ref 134–144)
Total Protein: 7.2 g/dL (ref 6.0–8.5)
eGFR: 96 mL/min/1.73 (ref >59)

## 2024-06-15 LAB — CBC WITH DIFFERENTIAL/PLATELET
Basophils Absolute: 0.1 x10E3/uL (ref 0.0–0.2)
Basos: 1 %
EOS (ABSOLUTE): 0.1 x10E3/uL (ref 0.0–0.4)
Eos: 1 %
Hematocrit: 42.4 % (ref 34.0–46.6)
Hemoglobin: 13.2 g/dL (ref 11.1–15.9)
Immature Grans (Abs): 0 x10E3/uL (ref 0.0–0.1)
Immature Granulocytes: 0 %
Lymphocytes Absolute: 1.5 x10E3/uL (ref 0.7–3.1)
Lymphs: 13 %
MCH: 28.6 pg (ref 26.6–33.0)
MCHC: 31.1 g/dL — ABNORMAL LOW (ref 31.5–35.7)
MCV: 92 fL (ref 79–97)
Monocytes Absolute: 0.7 x10E3/uL (ref 0.1–0.9)
Monocytes: 6 %
Neutrophils Absolute: 9.2 x10E3/uL — ABNORMAL HIGH (ref 1.4–7.0)
Neutrophils: 79 %
Platelets: 416 x10E3/uL (ref 150–450)
RBC: 4.61 x10E6/uL (ref 3.77–5.28)
RDW: 13.2 % (ref 11.7–15.4)
WBC: 11.6 x10E3/uL — ABNORMAL HIGH (ref 3.4–10.8)

## 2024-06-15 LAB — URINALYSIS, MICROSCOPIC ONLY
Casts: NONE SEEN /LPF
Epithelial Cells (non renal): >10 /HPF — AB (ref 0–10)
RBC, Urine: NONE SEEN /HPF (ref 0–2)
WBC, UA: >30 /HPF — AB (ref 0–5)

## 2024-06-15 LAB — SEDIMENTATION RATE: Sed Rate: 28 mm/h (ref 0–32)

## 2024-06-15 LAB — C-REACTIVE PROTEIN: CRP: 20 mg/L — ABNORMAL HIGH (ref 0–10)

## 2024-06-20 ENCOUNTER — Other Ambulatory Visit (HOSPITAL_COMMUNITY): Payer: Self-pay

## 2024-06-20 ENCOUNTER — Ambulatory Visit: Payer: Self-pay

## 2024-06-20 MED ORDER — CEPHALEXIN 500 MG PO CAPS
500.0000 mg | ORAL_CAPSULE | Freq: Three times a day (TID) | ORAL | 0 refills | Status: AC
  Filled 2024-06-20: qty 15, 5d supply, fill #0

## 2024-06-21 ENCOUNTER — Other Ambulatory Visit (HOSPITAL_COMMUNITY): Payer: Self-pay

## 2024-06-25 ENCOUNTER — Encounter: Payer: Self-pay | Admitting: Radiology

## 2024-07-06 DIAGNOSIS — Z0100 Encounter for examination of eyes and vision without abnormal findings: Secondary | ICD-10-CM | POA: Diagnosis not present

## 2024-07-29 DIAGNOSIS — G43109 Migraine with aura, not intractable, without status migrainosus: Secondary | ICD-10-CM

## 2024-07-30 ENCOUNTER — Telehealth (HOSPITAL_COMMUNITY): Payer: Self-pay

## 2024-07-30 ENCOUNTER — Other Ambulatory Visit (HOSPITAL_COMMUNITY): Payer: Self-pay

## 2024-07-30 MED ORDER — NURTEC 75 MG PO TBDP
75.0000 mg | ORAL_TABLET | ORAL | 3 refills | Status: DC
  Filled 2024-07-30: qty 8, 30d supply, fill #0

## 2024-07-31 ENCOUNTER — Other Ambulatory Visit (HOSPITAL_COMMUNITY): Payer: Self-pay

## 2024-07-31 ENCOUNTER — Telehealth (HOSPITAL_COMMUNITY): Payer: Self-pay

## 2024-07-31 DIAGNOSIS — T43015A Adverse effect of tricyclic antidepressants, initial encounter: Secondary | ICD-10-CM

## 2024-07-31 DIAGNOSIS — G43109 Migraine with aura, not intractable, without status migrainosus: Secondary | ICD-10-CM

## 2024-07-31 NOTE — Telephone Encounter (Signed)
 PA request has been Received. New Encounter has been or will be created for follow up. For additional info see Pharmacy Prior Auth telephone encounter from 07/31/24.

## 2024-07-31 NOTE — Telephone Encounter (Signed)
 Pharmacy Patient Advocate Encounter   Received notification from Pt Calls Messages that prior authorization for Nurtec 75MG  dispersible tablets  is required/requested.   Insurance verification completed.   The patient is insured through [facility].   Per test claim: PA required; PA submitted to above mentioned insurance via Latent Key/confirmation #/EOC [id number] Status is pending

## 2024-08-02 ENCOUNTER — Other Ambulatory Visit (HOSPITAL_BASED_OUTPATIENT_CLINIC_OR_DEPARTMENT_OTHER): Payer: Self-pay

## 2024-08-02 NOTE — Telephone Encounter (Signed)
 Additional information has been requested from the patient's insurance in order to proceed with the prior authorization request. Requested information has been sent, or form has been filled out and faxed back to -- . Initial PA got stuck in [facility] and never sent to insurance company. I spoke with them and submitted it over the phone. Reference # [id number].

## 2024-08-07 NOTE — Telephone Encounter (Signed)
 Since she hasn't tried any of these, if one of these suggested medication is appropriate, Please send in a new RX and discontinue this one. If not, please advise as to why it's not appropriate so that we may request a Prior Authorization. Please note, some preferred medications may still require a PA.  If the suggested medications have not been trialed and there are no contraindications to their use, the PA will not be submitted, as it will not be approved

## 2024-08-07 NOTE — Telephone Encounter (Signed)
 Additional information has been requested from the patient's insurance in order to proceed with the prior authorization request.  Prior Authorization form/request asks a question that requires your assistance. Please see the question below and advise accordingly. (I don't see any of these medications in the patient's chart as tried.)  Has the patient had a trial of one of the following agents: divalproex sodium, sodium valproate, topiramate, propranolol, timolol, metoprolol, amitriptyline , venlafaxine, atenolol or nadolol?

## 2024-08-08 ENCOUNTER — Other Ambulatory Visit (HOSPITAL_COMMUNITY): Payer: Self-pay

## 2024-08-08 MED ORDER — AMITRIPTYLINE HCL 10 MG PO TABS
10.0000 mg | ORAL_TABLET | Freq: Every day | ORAL | 1 refills | Status: DC
  Filled 2024-08-08: qty 30, 30d supply, fill #0

## 2024-08-09 ENCOUNTER — Other Ambulatory Visit (HOSPITAL_COMMUNITY): Payer: Self-pay

## 2024-08-13 NOTE — Telephone Encounter (Signed)
 Received a form for additional info, if pt tries the preferred meds and then the Nurtec PA is still needed. Indexed form to Media tab in case it's needed later.

## 2024-08-14 LAB — URINALYSIS, MICROSCOPIC ONLY

## 2024-08-15 ENCOUNTER — Other Ambulatory Visit (HOSPITAL_COMMUNITY): Payer: Self-pay

## 2024-08-21 ENCOUNTER — Telehealth: Admitting: Family Medicine

## 2024-08-21 DIAGNOSIS — B9689 Other specified bacterial agents as the cause of diseases classified elsewhere: Secondary | ICD-10-CM | POA: Diagnosis not present

## 2024-08-21 DIAGNOSIS — J019 Acute sinusitis, unspecified: Secondary | ICD-10-CM

## 2024-08-21 MED ORDER — DOXYCYCLINE HYCLATE 100 MG PO TABS: 100.0000 mg | ORAL_TABLET | Freq: Two times a day (BID) | ORAL | 0 refills | Status: AC

## 2024-08-21 NOTE — Progress Notes (Signed)
 E-Visit for Sinus Problems  We are sorry that you are not feeling well.  Here is how we plan to help!  Based on what you have shared with me it looks like you have sinusitis.  Sinusitis is inflammation and infection in the sinus cavities of the head.  Based on your presentation I believe you most likely have Acute Bacterial Sinusitis.  This is an infection caused by bacteria and is treated with antibiotics. I have prescribed Doxycycline  100mg  by mouth twice a day for 7 days. You may use an oral decongestant such as Mucinex  D or if you have glaucoma or high blood pressure use plain Mucinex . Saline nasal spray help and can safely be used as often as needed for congestion.  If you develop worsening sinus pain, fever or notice severe headache and vision changes, or if symptoms are not better after completion of antibiotic, please schedule an appointment with a health care provider.    Sinus infections are not as easily transmitted as other respiratory infection, however we still recommend that you avoid close contact with loved ones, especially the very young and elderly.  Remember to wash your hands thoroughly throughout the day as this is the number one way to prevent the spread of infection!  Home Care: Only take medications as instructed by your medical team. Complete the entire course of an antibiotic. Do not take these medications with alcohol. A steam or ultrasonic humidifier can help congestion.  You can place a towel over your head and breathe in the steam from hot water  coming from a faucet. Avoid close contacts especially the very young and the elderly. Cover your mouth when you cough or sneeze. Always remember to wash your hands.  Get Help Right Away If: You develop worsening fever or sinus pain. You develop a severe head ache or visual changes. Your symptoms persist after you have completed your treatment plan.  Make sure you Understand these instructions. Will watch your  condition. Will get help right away if you are not doing well or get worse.  Your e-visit answers were reviewed by a board certified advanced clinical practitioner to complete your personal care plan.  Depending on the condition, your plan could have included both over the counter or prescription medications.  If there is a problem please reply  once you have received a response from your provider.  Your safety is important to us .  If you have drug allergies check your prescription carefully.    You can use MyChart to ask questions about today's visit, request a non-urgent call back, or ask for a work or school excuse for 24 hours related to this e-Visit. If it has been greater than 24 hours you will need to follow up with your provider, or enter a new e-Visit to address those concerns.  You will get an e-mail in the next two days asking about your experience.  I hope that your e-visit has been valuable and will speed your recovery. Thank you for using e-visits.  I have spent 5 minutes in review of e-visit questionnaire, review and updating patient chart, medical decision making and response to patient.   Shenna Brissette, FNP

## 2024-08-24 ENCOUNTER — Other Ambulatory Visit (HOSPITAL_COMMUNITY): Payer: Self-pay

## 2024-08-27 ENCOUNTER — Other Ambulatory Visit (HOSPITAL_COMMUNITY): Payer: Self-pay

## 2024-09-04 ENCOUNTER — Other Ambulatory Visit: Payer: Self-pay | Admitting: Family

## 2024-09-04 ENCOUNTER — Other Ambulatory Visit (HOSPITAL_COMMUNITY): Payer: Self-pay

## 2024-09-04 ENCOUNTER — Other Ambulatory Visit: Payer: Self-pay

## 2024-09-04 MED ORDER — FLUCONAZOLE 100 MG PO TABS
100.0000 mg | ORAL_TABLET | Freq: Once | ORAL | 0 refills | Status: AC
  Filled 2024-09-04: qty 3, 3d supply, fill #0

## 2024-09-04 MED ORDER — AMITRIPTYLINE HCL 50 MG PO TABS
ORAL_TABLET | ORAL | 2 refills | Status: AC
  Filled 2024-09-04: qty 30, 30d supply, fill #0

## 2024-09-17 ENCOUNTER — Other Ambulatory Visit (HOSPITAL_COMMUNITY): Payer: Self-pay

## 2024-09-24 ENCOUNTER — Other Ambulatory Visit (HOSPITAL_COMMUNITY): Payer: Self-pay

## 2024-09-24 MED ORDER — NURTEC 75 MG PO TBDP
75.0000 mg | ORAL_TABLET | ORAL | 2 refills | Status: AC
  Filled 2024-09-24: qty 16, 32d supply, fill #0

## 2024-09-24 NOTE — Progress Notes (Unsigned)
 Patient has been on Amitriptyline  with a dose titration for approximately 6 weeks. She started at 25 mg nightly and has increased to 50 mg nightly at bedtime. Patient has been on the higher dose (50 mg) for approximately 2 weeks and cannot tolerate the medication due to severe dry mouth despite increased water intake.   Patient has not noticed significant improvement in her migraines with this medication.  She is already on Maxalt  (a triptan) for abortive therapy, but due to headache frequency, she would benefit most from a preventative medication.   This note is to serve as documentation of failure of amitriptyline  for migraine prevention due to intolerance and inefficacy.  At this point, would highly recommend every other day Nurtec for the patient.   Plan will be to re-submit PA for Nurtec with this new documentation.   Saddie JULIANNA Sacks, PA-C

## 2024-09-27 ENCOUNTER — Other Ambulatory Visit (HOSPITAL_COMMUNITY): Payer: Self-pay

## 2024-09-27 ENCOUNTER — Telehealth (HOSPITAL_COMMUNITY): Payer: Self-pay

## 2024-09-27 NOTE — Telephone Encounter (Signed)
 Where is this request coming from? [facility]  Medication: Nurtec 75mg  Prior authorization required? Yes If YES, on primary or secondary insurance? Primary Comments:

## 2024-09-28 ENCOUNTER — Other Ambulatory Visit (HOSPITAL_COMMUNITY): Payer: Self-pay

## 2024-09-28 NOTE — Telephone Encounter (Signed)
 PA request has been Received. New Encounter has been or will be created for follow up. For additional info see Pharmacy Prior Auth telephone encounter from 09/28/24.

## 2024-10-08 ENCOUNTER — Other Ambulatory Visit

## 2024-10-11 ENCOUNTER — Other Ambulatory Visit

## 2024-10-18 ENCOUNTER — Encounter
